# Patient Record
Sex: Male | Born: 1940 | Race: White | Hispanic: No | Smoking: Current every day smoker
Health system: Southern US, Community
[De-identification: ages and names within clinical notes are randomized; demographics above are authoritative.]

## PROBLEM LIST (undated history)

## (undated) DIAGNOSIS — E119 Type 2 diabetes mellitus without complications: Secondary | ICD-10-CM

## (undated) DIAGNOSIS — I10 Essential (primary) hypertension: Secondary | ICD-10-CM

## (undated) DIAGNOSIS — F0390 Unspecified dementia without behavioral disturbance: Secondary | ICD-10-CM

## (undated) HISTORY — PX: CHOLECYSTECTOMY: SHX55

---

## 2014-07-16 ENCOUNTER — Emergency Department (HOSPITAL_COMMUNITY): Payer: Medicare Other

## 2014-07-16 ENCOUNTER — Emergency Department (HOSPITAL_COMMUNITY)
Admission: EM | Admit: 2014-07-16 | Discharge: 2014-07-16 | Disposition: A | Discharge status: Home / Self Care | Payer: Medicare Other | Attending: Emergency Medicine | Admitting: Emergency Medicine

## 2014-07-16 ENCOUNTER — Encounter (HOSPITAL_COMMUNITY): Payer: Self-pay | Admitting: Emergency Medicine

## 2014-07-16 DIAGNOSIS — Y998 Other external cause status: Secondary | ICD-10-CM | POA: Insufficient documentation

## 2014-07-16 DIAGNOSIS — F0390 Unspecified dementia without behavioral disturbance: Secondary | ICD-10-CM | POA: Diagnosis not present

## 2014-07-16 DIAGNOSIS — Z720 Tobacco use: Secondary | ICD-10-CM | POA: Diagnosis not present

## 2014-07-16 DIAGNOSIS — E119 Type 2 diabetes mellitus without complications: Secondary | ICD-10-CM | POA: Diagnosis not present

## 2014-07-16 DIAGNOSIS — W19XXXA Unspecified fall, initial encounter: Secondary | ICD-10-CM

## 2014-07-16 DIAGNOSIS — W1839XA Other fall on same level, initial encounter: Secondary | ICD-10-CM | POA: Insufficient documentation

## 2014-07-16 DIAGNOSIS — S3992XA Unspecified injury of lower back, initial encounter: Secondary | ICD-10-CM | POA: Insufficient documentation

## 2014-07-16 DIAGNOSIS — Z794 Long term (current) use of insulin: Secondary | ICD-10-CM | POA: Diagnosis not present

## 2014-07-16 DIAGNOSIS — Y9389 Activity, other specified: Secondary | ICD-10-CM | POA: Diagnosis not present

## 2014-07-16 DIAGNOSIS — Y9289 Other specified places as the place of occurrence of the external cause: Secondary | ICD-10-CM | POA: Insufficient documentation

## 2014-07-16 DIAGNOSIS — M546 Pain in thoracic spine: Secondary | ICD-10-CM

## 2014-07-16 DIAGNOSIS — Z79899 Other long term (current) drug therapy: Secondary | ICD-10-CM | POA: Insufficient documentation

## 2014-07-16 HISTORY — DX: Type 2 diabetes mellitus without complications: E11.9

## 2014-07-16 HISTORY — DX: Unspecified dementia, unspecified severity, without behavioral disturbance, psychotic disturbance, mood disturbance, and anxiety: F03.90

## 2014-07-16 NOTE — ED Notes (Signed)
[facility] c/o a fall today in which he reports he may have hit his head. No LOC. Pt also c/o back pain after the fall. Pt requesting a snack upon assessment.

## 2014-07-16 NOTE — ED Notes (Signed)
[facility] Expected date:  Expected time:  Means of arrival:  Comments: EMS- elderly, fall

## 2014-07-16 NOTE — Discharge Instructions (Signed)
Return to the ED with any concerns including weakness of legs, not able to urinate, loss of control of bowel or bladder, decreased level of alertness/lethargy, or any other alarming symptoms °

## 2014-07-16 NOTE — ED Provider Notes (Signed)
CSN: [id number]     Arrival date & time 07/16/14  1733 History   First MD Initiated Contact with Patient 07/16/14 1747     Chief Complaint  Patient presents with  . Fall  . Back Pain     (Consider location/radiation/quality/duration/timing/severity/associated sxs/prior Treatment) HPI  A LEVEL 5 CAVEAT PERTAINS DUE TO DEMENTIA Pt presenting with c/o fall.  He c/o mid upper back pain.  No LOC.  He states he hit his head on triage, to me he denies hitting his head. He has a scrape on his left hand- denies pain in left hand.   Past Medical History  Diagnosis Date  . Dementia   . Diabetes mellitus without complication    History reviewed. No pertinent past surgical history. No family history on file. History  Substance Use Topics  . Smoking status: Current Every Day Smoker -- 1.00 packs/day    Types: Cigarettes  . Smokeless tobacco: Not on file  . Alcohol Use: No    Review of Systems  UNABLE TO OBTAIN ROS DUE TO LEVEL 5 CAVEAT    Allergies  Review of patient's allergies indicates no known allergies.  Home Medications   Prior to Admission medications   Medication Sig Start Date End Date Taking? Authorizing Provider  acetaminophen (TYLENOL) 325 MG tablet Take 650 mg by mouth 4 (four) times daily.   Yes Historical Provider, MD  acetaminophen (TYLENOL) 500 MG tablet Take 500 mg by mouth every 6 (six) hours as needed for fever (for fever 99.5-101).   Yes Historical Provider, MD  alum & mag hydroxide-simeth (MAALOX/MYLANTA) 200-200-20 MG/5ML suspension Take 30 mLs by mouth every 6 (six) hours as needed for indigestion or heartburn.   Yes Historical Provider, MD  carvedilol (COREG) 12.5 MG tablet Take 12.5 mg by mouth 2 (two) times daily.   Yes Historical Provider, MD  divalproex (DEPAKOTE) 250 MG DR tablet Take 250 mg by mouth 2 (two) times daily.   Yes Historical Provider, MD  donepezil (ARICEPT) 10 MG tablet Take 10 mg by mouth daily.   Yes Historical Provider, MD  DULoxetine  (CYMBALTA) 30 MG capsule Take 30 mg by mouth daily.   Yes Historical Provider, MD  ferrous sulfate 325 (65 FE) MG tablet Take 325 mg by mouth 2 (two) times daily.   Yes Historical Provider, MD  guaifenesin (ROBITUSSIN) 100 MG/5ML syrup Take 200 mg by mouth every 6 (six) hours as needed for cough.   Yes Historical Provider, MD  insulin glargine (LANTUS) 100 UNIT/ML injection Inject 16 Units into the skin at bedtime.   Yes Historical Provider, MD  latanoprost (XALATAN) 0.005 % ophthalmic solution Place 1 drop into both eyes at bedtime.   Yes Historical Provider, MD  loperamide (IMODIUM) 2 MG capsule Take 2 mg by mouth as needed for diarrhea or loose stools.   Yes Historical Provider, MD  LORazepam (ATIVAN) 0.5 MG tablet Take 0.5 mg by mouth 3 (three) times daily.   Yes Historical Provider, MD  losartan-hydrochlorothiazide (HYZAAR) 100-12.5 MG per tablet Take 1 tablet by mouth daily.   Yes Historical Provider, MD  magnesium hydroxide (MILK OF MAGNESIA) 400 MG/5ML suspension Take 30 mLs by mouth at bedtime as needed for mild constipation.   Yes Historical Provider, MD  metFORMIN (GLUCOPHAGE) 1000 MG tablet Take 1,000 mg by mouth 2 (two) times daily.   Yes Historical Provider, MD  neomycin-bacitracin-polymyxin (NEOSPORIN) [phone] ointment Apply 1 application topically as needed (for skin tears or abrasions).   Yes Historical Provider,  MD  pantoprazole (PROTONIX) 40 MG tablet Take 40 mg by mouth 2 (two) times daily.   Yes Historical Provider, MD  sertraline (ZOLOFT) 25 MG tablet Take 75 mg by mouth daily.   Yes Historical Provider, MD  sitaGLIPtin (JANUVIA) 100 MG tablet Take 100 mg by mouth daily.   Yes Historical Provider, MD  thiamine (VITAMIN B-1) 100 MG tablet Take 100 mg by mouth daily.   Yes Historical Provider, MD  traZODone (DESYREL) 50 MG tablet Take 50 mg by mouth at bedtime.   Yes Historical Provider, MD  Vitamin D, Ergocalciferol, (DRISDOL) 50000 UNITS CAPS capsule Take 50,000 Units by  mouth every 7 (seven) days. On saturday   Yes Historical Provider, MD   BP 159/70 mmHg  Pulse 72  Temp(Src) 98.5 F (36.9 C) (Oral)  Resp 18  SpO2 96%  Vitals reviewed Physical Exam  Physical Examination: General appearance - alert, well appearing, and in no distress Mental status - alert, oriented to person, place, and time Eyes - no conjunctival injection, no scleral icterus Neck - no midline tenderness to palpation Chest - clear to auscultation, no wheezes, rales or rhonchi, symmetric air entry Heart - normal rate, regular rhythm, normal S1, S2, no murmurs, rubs, clicks or gallops Abdomen - soft, nontender, nondistended, no masses or organomegaly Back exam - ttp in thoracic midline, no lumbar midline tenderness to palpation, no CVA tenderness Extremities - peripheral pulses normal, no pedal edema, no clubbing or cyanosis, no ttp over left hand or wrist ,no snuffbox tenderness Skin - normal coloration and turgor, no rashes, abrasion over thenar eminence of left hand  ED Course  Procedures (including critical care time) Labs Review Labs Reviewed - No data to display  Imaging Review Dg Thoracic Spine 2 View  07/16/2014   CLINICAL DATA:  Fall, thoracic spine pain.  EXAM: THORACIC SPINE - 2 VIEW  COMPARISON:  None.  FINDINGS: Degenerative changes throughout the thoracic spine with disc space narrowing and spurring. Normal alignment. No fracture.  IMPRESSION: Degenerative changes.  No acute bony abnormality.   Electronically Signed   By: Charlett Nose M.D.   On: 07/16/2014 18:53   Ct Head Wo Contrast  07/16/2014   CLINICAL DATA:  Initial encounter for fall today with trauma to back of head. Dementia.  EXAM: CT HEAD WITHOUT CONTRAST  TECHNIQUE: Contiguous axial images were obtained from the base of the skull through the vertex without intravenous contrast.  COMPARISON:  02/26/2014  FINDINGS: Sinuses/Soft tissues: Equivocal right posterior scalp soft tissue swelling on image 25 series 3. No  skull fracture. Clear paranasal sinuses and mastoid air cells.  Intracranial: Moderate low density in the periventricular white matter likely related to small vessel disease. Advanced cerebral atrophy. Vertebral and carotid atherosclerosis bilaterally. Remote lacunar infarct in the left basal ganglia. No mass lesion, hemorrhage, hydrocephalus, acute infarct, intra-axial, or extra-axial fluid collection.  IMPRESSION: 1.  No acute intracranial abnormality. 2.  Cerebral atrophy and small vessel ischemic change.   Electronically Signed   By: Jeronimo Greaves M.D.   On: 07/16/2014 19:29     EKG Interpretation None      MDM   Final diagnoses:  Fall  Midline thoracic back pain    Pt presenting with c/o mid upper back pain after fall today.  Unclear whether he hit his head.  No bony tenderness of hand, abrasion of thenar eminence.  Discharged with strict return precautions.  Pt agreeable with plan.    Ethelda Chick, MD 07/16/14  2314 

## 2014-07-16 NOTE — Progress Notes (Signed)
CSW met with patient at bedside. There was no family present. Patient confirms that he lives at [facility]. Patient states that he has been living there for 1 month. Patient states that he fell on his left side and that his back is in pain.  According to patient, he is usually able to bathe and put on his own clothes independently but needs assistance sometimes. Patient states he is always able to feed himself independently.   Per nurse, patient was transported to x-ray prior to the [place] interview.  Willette Brace [phone] ED CSW 07/16/2014 7:41 PM

## 2014-07-16 NOTE — ED Notes (Signed)
Skin tear to left hand  Just below thumb. Wound cleaned with Normal Saline and guaze. Wound dressed with non-adherent dressing and tegaderm.

## 2014-07-16 NOTE — ED Notes (Signed)
MD Linker at bedside.  

## 2014-07-16 NOTE — ED Notes (Signed)
Pt. Ready for discharge , [facility] called for transport. 

## 2014-07-19 ENCOUNTER — Emergency Department (HOSPITAL_COMMUNITY): Payer: Medicare Other

## 2014-07-19 ENCOUNTER — Encounter (HOSPITAL_COMMUNITY): Payer: Self-pay | Admitting: Emergency Medicine

## 2014-07-19 ENCOUNTER — Emergency Department (HOSPITAL_COMMUNITY)
Admission: EM | Admit: 2014-07-19 | Discharge: 2014-07-19 | Disposition: A | Discharge status: Home / Self Care | Payer: Medicare Other | Source: Home / Self Care | Attending: Emergency Medicine | Admitting: Emergency Medicine

## 2014-07-19 DIAGNOSIS — Y9389 Activity, other specified: Secondary | ICD-10-CM

## 2014-07-19 DIAGNOSIS — S2241XA Multiple fractures of ribs, right side, initial encounter for closed fracture: Secondary | ICD-10-CM | POA: Insufficient documentation

## 2014-07-19 DIAGNOSIS — Y998 Other external cause status: Secondary | ICD-10-CM

## 2014-07-19 DIAGNOSIS — F0390 Unspecified dementia without behavioral disturbance: Secondary | ICD-10-CM

## 2014-07-19 DIAGNOSIS — Y9289 Other specified places as the place of occurrence of the external cause: Secondary | ICD-10-CM

## 2014-07-19 DIAGNOSIS — E119 Type 2 diabetes mellitus without complications: Secondary | ICD-10-CM

## 2014-07-19 DIAGNOSIS — W19XXXA Unspecified fall, initial encounter: Secondary | ICD-10-CM | POA: Insufficient documentation

## 2014-07-19 DIAGNOSIS — Z79899 Other long term (current) drug therapy: Secondary | ICD-10-CM | POA: Insufficient documentation

## 2014-07-19 DIAGNOSIS — Z720 Tobacco use: Secondary | ICD-10-CM

## 2014-07-19 DIAGNOSIS — R531 Weakness: Secondary | ICD-10-CM | POA: Diagnosis not present

## 2014-07-19 DIAGNOSIS — Z794 Long term (current) use of insulin: Secondary | ICD-10-CM

## 2014-07-19 DIAGNOSIS — R0789 Other chest pain: Secondary | ICD-10-CM

## 2014-07-19 DIAGNOSIS — S2231XA Fracture of one rib, right side, initial encounter for closed fracture: Secondary | ICD-10-CM

## 2014-07-19 DIAGNOSIS — I6329 Cerebral infarction due to unspecified occlusion or stenosis of other precerebral arteries: Secondary | ICD-10-CM | POA: Diagnosis not present

## 2014-07-19 LAB — URINALYSIS, ROUTINE W REFLEX MICROSCOPIC
BILIRUBIN URINE: NEGATIVE
GLUCOSE, UA: NEGATIVE mg/dL
Ketones, ur: NEGATIVE mg/dL
Nitrite: NEGATIVE
Protein, ur: >300 mg/dL — AB
Specific Gravity, Urine: 1.021 (ref 1.005–1.030)
Urobilinogen, UA: 0.2 mg/dL (ref 0.0–1.0)
pH: 5 (ref 5.0–8.0)

## 2014-07-19 LAB — URINE MICROSCOPIC-ADD ON

## 2014-07-19 MED ORDER — TRAMADOL-ACETAMINOPHEN 37.5-325 MG PO TABS: 1.0000 | ORAL_TABLET | Freq: Four times a day (QID) | ORAL | Status: DC | PRN

## 2014-07-19 NOTE — Progress Notes (Signed)
CSW met with patient at bedside. There was no family present at bedside. Patient presented to [facility] last Friday from [place] due to fall. Per note, Staff report that patient has been complaining of R flank pain since he returned to the facility.   Per note, [facility] wants urinalysis. Patient stated to CSW that he feels a lot better. CSW confirmed from chart that patient is from [facility]. Patient has a history of dementia and was not able to answer some questions.  Rennis Petty was listed in patients chart as a primary contact - [phone]  Willette Brace [phone] ED CSW 07/19/2014 8:46 PM

## 2014-07-19 NOTE — ED Provider Notes (Signed)
CSN: [id number]     Arrival date & time 07/19/14  1458 History   First MD Initiated Contact with Patient 07/19/14 1529     Chief Complaint  Patient presents with  . Generalized Body Aches     (Consider location/radiation/quality/duration/timing/severity/associated sxs/prior Treatment) The history is provided by the patient and the nursing home.   Barry Buckley is a 74 y.o. male who has been sent here, by his nursing care facility for reported problem of right chest wall pain after a fall 3 days ago.  They also are requesting a urinalysis be performed, for an unclear reason.  Level V Caveat- Dementia    Past Medical History  Diagnosis Date  . Dementia   . Diabetes mellitus without complication    History reviewed. No pertinent past surgical history. History reviewed. No pertinent family history. History  Substance Use Topics  . Smoking status: Current Every Day Smoker -- 1.00 packs/day    Types: Cigarettes  . Smokeless tobacco: Not on file  . Alcohol Use: No    Review of Systems  Unable to perform ROS     Allergies  Review of patient's allergies indicates no known allergies.  Home Medications   Prior to Admission medications   Medication Sig Start Date End Date Taking? Authorizing Provider  acetaminophen (TYLENOL) 325 MG tablet Take 650 mg by mouth 4 (four) times daily.   Yes Historical Provider, MD  acetaminophen (TYLENOL) 500 MG tablet Take 500 mg by mouth every 6 (six) hours as needed for fever (for fever 99.5-101).   Yes Historical Provider, MD  alum & mag hydroxide-simeth (MAALOX/MYLANTA) 200-200-20 MG/5ML suspension Take 30 mLs by mouth every 6 (six) hours as needed for indigestion or heartburn.   Yes Historical Provider, MD  carvedilol (COREG) 12.5 MG tablet Take 12.5 mg by mouth 2 (two) times daily.   Yes Historical Provider, MD  divalproex (DEPAKOTE) 250 MG DR tablet Take 250 mg by mouth 2 (two) times daily.   Yes Historical Provider, MD  donepezil (ARICEPT) 10  MG tablet Take 10 mg by mouth daily.   Yes Historical Provider, MD  DULoxetine (CYMBALTA) 30 MG capsule Take 30 mg by mouth daily.   Yes Historical Provider, MD  ferrous sulfate 325 (65 FE) MG tablet Take 325 mg by mouth 2 (two) times daily.   Yes Historical Provider, MD  guaifenesin (ROBITUSSIN) 100 MG/5ML syrup Take 200 mg by mouth every 6 (six) hours as needed for cough.   Yes Historical Provider, MD  insulin glargine (LANTUS) 100 UNIT/ML injection Inject 16 Units into the skin at bedtime.   Yes Historical Provider, MD  latanoprost (XALATAN) 0.005 % ophthalmic solution Place 1 drop into both eyes at bedtime.   Yes Historical Provider, MD  loperamide (IMODIUM) 2 MG capsule Take 2 mg by mouth as needed for diarrhea or loose stools.   Yes Historical Provider, MD  LORazepam (ATIVAN) 0.5 MG tablet Take 0.5 mg by mouth 3 (three) times daily.   Yes Historical Provider, MD  losartan-hydrochlorothiazide (HYZAAR) 100-12.5 MG per tablet Take 1 tablet by mouth daily.   Yes Historical Provider, MD  magnesium hydroxide (MILK OF MAGNESIA) 400 MG/5ML suspension Take 30 mLs by mouth at bedtime as needed for mild constipation.   Yes Historical Provider, MD  metFORMIN (GLUCOPHAGE) 1000 MG tablet Take 1,000 mg by mouth 2 (two) times daily.   Yes Historical Provider, MD  neomycin-bacitracin-polymyxin (NEOSPORIN) [phone] ointment Apply 1 application topically as needed (for skin tears or abrasions).  Yes Historical Provider, MD  pantoprazole (PROTONIX) 40 MG tablet Take 40 mg by mouth 2 (two) times daily.   Yes Historical Provider, MD  sertraline (ZOLOFT) 25 MG tablet Take 75 mg by mouth daily.   Yes Historical Provider, MD  sitaGLIPtin (JANUVIA) 100 MG tablet Take 100 mg by mouth daily.   Yes Historical Provider, MD  thiamine (VITAMIN B-1) 100 MG tablet Take 100 mg by mouth daily.   Yes Historical Provider, MD  traZODone (DESYREL) 50 MG tablet Take 50 mg by mouth at bedtime.   Yes Historical Provider, MD  Vitamin  D, Ergocalciferol, (DRISDOL) 50000 UNITS CAPS capsule Take 50,000 Units by mouth every Saturday.    Yes Historical Provider, MD  traMADol-acetaminophen (ULTRACET) 37.5-325 MG per tablet Take 1 tablet by mouth every 6 (six) hours as needed. 07/19/14   Flint Melter, MD   BP 195/94 mmHg  Pulse 65  Temp(Src) 98.1 F (36.7 C) (Oral)  Resp 16  SpO2 96% Physical Exam  Constitutional: He appears well-developed and well-nourished.  HENT:  Head: Normocephalic and atraumatic.  Right Ear: External ear normal.  Left Ear: External ear normal.  Eyes: Conjunctivae and EOM are normal. Pupils are equal, round, and reactive to light.  Neck: Normal range of motion and phonation normal. Neck supple.  Cardiovascular: Normal rate, regular rhythm and normal heart sounds.   Pulmonary/Chest: Effort normal and breath sounds normal. He exhibits tenderness (Right chest wall, diffuse, without crepitation.). He exhibits no bony tenderness.  Abdominal: Soft. There is no tenderness.  Musculoskeletal: Normal range of motion.  Tenderness of lower thoracic spine without palpable step-off.  Neurological: He is alert. No cranial nerve deficit or sensory deficit. He exhibits normal muscle tone. Coordination normal.  Normal strength in arms and legs bilaterally  Skin: Skin is warm, dry and intact.  Scattered bruising of the forearms bilaterally.  Psychiatric: He has a normal mood and affect. His behavior is normal.  Nursing note and vitals reviewed.   ED Course  Procedures (including critical care time)  Medications - No data to display  Patient Vitals for the past 24 hrs:  BP Temp Temp src Pulse Resp SpO2  07/19/14 2040 195/94 mmHg 98.1 F (36.7 C) Oral 65 16 96 %  07/19/14 1931 152/78 mmHg 97.8 F (36.6 C) Oral 64 18 95 %  07/19/14 1508 158/77 mmHg 97.8 F (36.6 C) Oral 67 20 95 %  07/19/14 1500 - - - - - 95 %    At D/C Reevaluation with update and discussion. After initial assessment and treatment, an  updated evaluation reveals he is able to ambulate, no additional c/o.Marland Kitchen [name]    Labs Review Labs Reviewed  URINALYSIS, ROUTINE W REFLEX MICROSCOPIC - Abnormal; Notable for the following:    Hgb urine dipstick TRACE (*)    Protein, ur >300 (*)    Leukocytes, UA TRACE (*)    All other components within normal limits  URINE MICROSCOPIC-ADD ON    Imaging Review Dg Chest 2 View (if Patient Has Fever And/or Copd)  07/19/2014   CLINICAL DATA:  [place] Initial encounter for fall 3 days ago 0 and now complaining of right flank pain.  EXAM: CHEST  2 VIEW  COMPARISON:  None.  FINDINGS: Lung volumes are low without focal airspace consolidation or pulmonary edema. No pleural effusion. No evidence for pneumothorax. Interstitial markings are diffusely coarsened with chronic features. Cardiopericardial silhouette is at upper limits of normal for size. Acute fractures of the right fifth  and sixth ribs are evident.  IMPRESSION: Mild vascular congestion with chronic underlying interstitial coarsening.  Fractures of the posterior right fifth and sixth ribs.   Electronically Signed   By: Kennith CenterEric  Mansell M.D.   On: 07/19/2014 15:43   Dg Ribs Unilateral Right  07/19/2014   CLINICAL DATA:  74 year old male all fall 3 days ago. Right flank pain.  EXAM: RIGHT RIBS - 2 VIEW  COMPARISON:  07/19/2014 chest x-ray.  FINDINGS: Acute right fourth through ninth rib fracture. Right tenth rib may also be fractured.  Remote right third and tenth rib fracture.  No obvious pneumothorax.  IMPRESSION: Acute right fourth through ninth rib fracture. Right tenth rib may also be fractured.   Electronically Signed   By: Bridgett LarssonSteve  Olson M.D.   On: 07/19/2014 16:45   Dg Thoracic Spine 2 View  07/19/2014   CLINICAL DATA:  74 year old male with the midline lower back pain. Recent fall on Friday.  EXAM: THORACIC SPINE - 2 VIEW  COMPARISON:  Concurrently obtained radiographs of the lumbar spine, chest and ribs  FINDINGS: No evidence of acute  fracture or malalignment. Multilevel flowing anterior osteophytes consistent with dystrophic idiopathic skeletal hyperostosis. Overall, the bones are slightly osteopenic. Atherosclerotic calcifications are noted in the thoracic aorta. A cardiac stent is visible.  IMPRESSION: 1. No acute fracture or malalignment. 2. Dystrophic idiopathic skeletal hyperostosis (DISH). 3. Aortic atherosclerosis.   Electronically Signed   By: Malachy MoanHeath  McCullough M.D.   On: 07/19/2014 16:42   Dg Lumbar Spine Complete  07/19/2014   CLINICAL DATA:  Fall 3 days ago with persistent right flank pain. Midline lower back pain and pain with coughing.  EXAM: LUMBAR SPINE - COMPLETE 4+ VIEW  COMPARISON:  CT 02/27/2014  FINDINGS: Vertebral body alignment and heights are within normal. There is moderate spondylosis throughout the lumbar spine and visualized thoracic spine. There is disc space narrowing at the L3-4 and L5-S1 levels. These findings are unchanged. There is no compression fracture or subluxation. There is moderate facet arthropathy. There is moderate calcified plaque over the abdominal aorta and iliac arteries.  IMPRESSION: No acute findings.  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   Electronically Signed   By: Elberta Fortisaniel  Boyle M.D.   On: 07/19/2014 16:44     EKG Interpretation None      MDM   Final diagnoses:  Pain, chest wall  Fracture of rib of right side, closed, initial encounter    Contusion chest wall, with rib fractures.  These are subacute.  No evidence for urinary tract infection.  Nursing Notes Reviewed/ Care Coordinated Applicable Imaging Reviewed Interpretation of Laboratory Data incorporated into ED treatment  The patient appears reasonably screened and/or stabilized for discharge and I doubt any other medical condition or other [facility] requiring further screening, evaluation, or treatment in the ED at this time prior to discharge.  Plan: Home Medications-  Ultracet; Home Treatments-Use walker if needed for assistance with walking,  rest; return here if the recommended treatment, does not improve the symptoms; Recommended follow up- PCP check up 1 week   Flint MelterElliott L Kohana Amble, MD 07/20/14 0002

## 2014-07-19 NOTE — Discharge Instructions (Signed)
You'll need to use a walker, or get assistance, whenever you get up, to prevent you from falling. See your doctor within the next week to be evaluated for the problems walking, and get a recheck on the rib fractures. We checked your urine today for signs of injury or infection, and there were no problems found.   Rib Fracture A rib fracture is a break or crack in one of the bones of the ribs. The ribs are a group of long, curved bones that wrap around your chest and attach to your spine. They protect your lungs and other organs in the chest cavity. A broken or cracked rib is often painful, but most do not cause other problems. Most rib fractures heal on their own over time. However, rib fractures can be more serious if multiple ribs are broken or if broken ribs move out of place and push against other structures. CAUSES   A direct blow to the chest. For example, this could happen during contact sports, a car accident, or a fall against a hard object.  Repetitive movements with high force, such as pitching a baseball or having severe coughing spells. SYMPTOMS   Pain when you breathe in or cough.  Pain when someone presses on the injured area. DIAGNOSIS  Your caregiver will perform a physical exam. Various imaging tests may be ordered to confirm the diagnosis and to look for related injuries. These tests may include a chest X-ray, computed tomography (CT), magnetic resonance imaging (MRI), or a bone scan. TREATMENT  Rib fractures usually heal on their own in 1-3 months. The longer healing period is often associated with a continued cough or other aggravating activities. During the healing period, pain control is very important. Medication is usually given to control pain. Hospitalization or surgery may be needed for more severe injuries, such as those in which multiple ribs are broken or the ribs have moved out of place.  HOME CARE INSTRUCTIONS   Avoid strenuous activity and any activities or  movements that cause pain. Be careful during activities and avoid bumping the injured rib.  Gradually increase activity as directed by your caregiver.  Only take over-the-counter or prescription medications as directed by your caregiver. Do not take other medications without asking your caregiver first.  Apply ice to the injured area for the first 1-2 days after you have been treated or as directed by your caregiver. Applying ice helps to reduce inflammation and pain.  Put ice in a plastic bag.  Place a towel between your skin and the bag.   Leave the ice on for 15-20 minutes at a time, every 2 hours while you are awake.  Perform deep breathing as directed by your caregiver. This will help prevent pneumonia, which is a common complication of a broken rib. Your caregiver may instruct you to:  Take deep breaths several times a day.  Try to cough several times a day, holding a pillow against the injured area.  Use a device called an incentive spirometer to practice deep breathing several times a day.  Drink enough fluids to keep your urine clear or pale yellow. This will help you avoid constipation.   Do not wear a rib belt or binder. These restrict breathing, which can lead to pneumonia.  SEEK IMMEDIATE MEDICAL CARE IF:   You have a fever.   You have difficulty breathing or shortness of breath.   You develop a continual cough, or you cough up thick or bloody sputum.  You  feel sick to your stomach (nausea), throw up (vomit), or have abdominal pain.   You have worsening pain not controlled with medications.  MAKE SURE YOU:  Understand these instructions.  Will watch your condition.  Will get help right away if you are not doing well or get worse. Document Released: 06/18/2005 Document Revised: 02/18/2013 Document Reviewed: 08/20/2012 [facility] Patient Information 2015 [employer] [place]. This information is not intended to replace advice given to you by your health care  provider. Make sure you discuss any questions you have with your health care provider.

## 2014-07-19 NOTE — ED Notes (Signed)
[facility] Expected date:  Expected time:  Means of arrival:  Comments: Ems- back pain

## 2014-07-19 NOTE — ED Notes (Signed)
[facility] was called for pt's transportation back to [facility]. 

## 2014-07-19 NOTE — ED Notes (Signed)
Per ems. Pt had fall on Friday, was sent here for eval and then [facility]. Staff report pt has been complaining of R flank pain since he returned and nursing home wants urinalysis. Pt has bilateral rhonchi and nursing home also wants chest x ray. Pt has hx of dementia, but is able to answer questions, alert per norm. Pt reports pain is mostly in his midline lower back and has pain with coughing. Has had cough, no sputum. Denies dysuria.

## 2014-07-19 NOTE — ED Notes (Signed)
[facility] here to transport pt back to [facility]. 

## 2014-07-21 ENCOUNTER — Emergency Department (HOSPITAL_COMMUNITY): Payer: Medicare Other

## 2014-07-21 ENCOUNTER — Inpatient Hospital Stay (HOSPITAL_COMMUNITY)
Admission: EM | Admit: 2014-07-21 | Discharge: 2014-07-26 | DRG: 065 | Disposition: A | Discharge status: Skilled Nursing Facility | Payer: Medicare Other | Attending: Internal Medicine | Admitting: Internal Medicine

## 2014-07-21 ENCOUNTER — Encounter (HOSPITAL_COMMUNITY): Payer: Self-pay | Admitting: *Deleted

## 2014-07-21 DIAGNOSIS — E860 Dehydration: Secondary | ICD-10-CM | POA: Diagnosis present

## 2014-07-21 DIAGNOSIS — F17210 Nicotine dependence, cigarettes, uncomplicated: Secondary | ICD-10-CM | POA: Diagnosis present

## 2014-07-21 DIAGNOSIS — I69354 Hemiplegia and hemiparesis following cerebral infarction affecting left non-dominant side: Secondary | ICD-10-CM | POA: Diagnosis not present

## 2014-07-21 DIAGNOSIS — D509 Iron deficiency anemia, unspecified: Secondary | ICD-10-CM | POA: Diagnosis present

## 2014-07-21 DIAGNOSIS — D72829 Elevated white blood cell count, unspecified: Secondary | ICD-10-CM | POA: Diagnosis present

## 2014-07-21 DIAGNOSIS — N189 Chronic kidney disease, unspecified: Secondary | ICD-10-CM | POA: Diagnosis present

## 2014-07-21 DIAGNOSIS — I129 Hypertensive chronic kidney disease with stage 1 through stage 4 chronic kidney disease, or unspecified chronic kidney disease: Secondary | ICD-10-CM | POA: Diagnosis present

## 2014-07-21 DIAGNOSIS — E46 Unspecified protein-calorie malnutrition: Secondary | ICD-10-CM | POA: Diagnosis present

## 2014-07-21 DIAGNOSIS — I6329 Cerebral infarction due to unspecified occlusion or stenosis of other precerebral arteries: Secondary | ICD-10-CM | POA: Diagnosis present

## 2014-07-21 DIAGNOSIS — I639 Cerebral infarction, unspecified: Secondary | ICD-10-CM

## 2014-07-21 DIAGNOSIS — Z6827 Body mass index (BMI) 27.0-27.9, adult: Secondary | ICD-10-CM | POA: Diagnosis not present

## 2014-07-21 DIAGNOSIS — Z794 Long term (current) use of insulin: Secondary | ICD-10-CM | POA: Diagnosis not present

## 2014-07-21 DIAGNOSIS — R531 Weakness: Secondary | ICD-10-CM | POA: Diagnosis present

## 2014-07-21 DIAGNOSIS — E785 Hyperlipidemia, unspecified: Secondary | ICD-10-CM | POA: Diagnosis present

## 2014-07-21 DIAGNOSIS — Z9181 History of falling: Secondary | ICD-10-CM | POA: Diagnosis not present

## 2014-07-21 DIAGNOSIS — E1159 Type 2 diabetes mellitus with other circulatory complications: Secondary | ICD-10-CM | POA: Insufficient documentation

## 2014-07-21 DIAGNOSIS — F0390 Unspecified dementia without behavioral disturbance: Secondary | ICD-10-CM | POA: Insufficient documentation

## 2014-07-21 DIAGNOSIS — N289 Disorder of kidney and ureter, unspecified: Secondary | ICD-10-CM

## 2014-07-21 DIAGNOSIS — E119 Type 2 diabetes mellitus without complications: Secondary | ICD-10-CM | POA: Diagnosis present

## 2014-07-21 DIAGNOSIS — R2990 Unspecified symptoms and signs involving the nervous system: Secondary | ICD-10-CM

## 2014-07-21 DIAGNOSIS — N179 Acute kidney failure, unspecified: Secondary | ICD-10-CM | POA: Diagnosis present

## 2014-07-21 DIAGNOSIS — I10 Essential (primary) hypertension: Secondary | ICD-10-CM | POA: Insufficient documentation

## 2014-07-21 HISTORY — DX: Essential (primary) hypertension: I10

## 2014-07-21 LAB — CBC WITH DIFFERENTIAL/PLATELET
Basophils Absolute: 0 10*3/uL (ref 0.0–0.1)
Basophils Relative: 0 % (ref 0–1)
Eosinophils Absolute: 0 10*3/uL (ref 0.0–0.7)
Eosinophils Relative: 0 % (ref 0–5)
HCT: 30.5 % — ABNORMAL LOW (ref 39.0–52.0)
Hemoglobin: 10.1 g/dL — ABNORMAL LOW (ref 13.0–17.0)
Lymphocytes Relative: 12 % (ref 12–46)
Lymphs Abs: 1.6 10*3/uL (ref 0.7–4.0)
MCH: 29.1 pg (ref 26.0–34.0)
MCHC: 33.1 g/dL (ref 30.0–36.0)
MCV: 87.9 fL (ref 78.0–100.0)
Monocytes Absolute: 0.8 10*3/uL (ref 0.1–1.0)
Monocytes Relative: 6 % (ref 3–12)
Neutro Abs: 10.5 10*3/uL — ABNORMAL HIGH (ref 1.7–7.7)
Neutrophils Relative %: 82 % — ABNORMAL HIGH (ref 43–77)
Platelets: 256 10*3/uL (ref 150–400)
RBC: 3.47 MIL/uL — ABNORMAL LOW (ref 4.22–5.81)
RDW: 14.7 % (ref 11.5–15.5)
WBC: 12.9 10*3/uL — ABNORMAL HIGH (ref 4.0–10.5)

## 2014-07-21 LAB — BASIC METABOLIC PANEL
Anion gap: 13 (ref 5–15)
BUN: 57 mg/dL — ABNORMAL HIGH (ref 6–23)
CO2: 21 mmol/L (ref 19–32)
Calcium: 9.5 mg/dL (ref 8.4–10.5)
Chloride: 105 mEq/L (ref 96–112)
Creatinine, Ser: 2.9 mg/dL — ABNORMAL HIGH (ref 0.50–1.35)
GFR calc Af Amer: 23 mL/min — ABNORMAL LOW (ref >90)
GFR calc non Af Amer: 20 mL/min — ABNORMAL LOW (ref >90)
Glucose, Bld: 173 mg/dL — ABNORMAL HIGH (ref 70–99)
Potassium: 4 mmol/L (ref 3.5–5.1)
Sodium: 139 mmol/L (ref 135–145)

## 2014-07-21 LAB — URINALYSIS, ROUTINE W REFLEX MICROSCOPIC
Glucose, UA: NEGATIVE mg/dL
Hgb urine dipstick: NEGATIVE
Ketones, ur: 15 mg/dL — AB
Leukocytes, UA: NEGATIVE
Nitrite: NEGATIVE
Protein, ur: >300 mg/dL — AB
Specific Gravity, Urine: 1.027 (ref 1.005–1.030)
Urobilinogen, UA: 0.2 mg/dL (ref 0.0–1.0)
pH: 5 (ref 5.0–8.0)

## 2014-07-21 LAB — I-STAT CHEM 8, ED
BUN: 54 mg/dL — ABNORMAL HIGH (ref 6–23)
Calcium, Ion: 1.27 mmol/L (ref 1.13–1.30)
Chloride: 108 mEq/L (ref 96–112)
Creatinine, Ser: 2.7 mg/dL — ABNORMAL HIGH (ref 0.50–1.35)
Glucose, Bld: 177 mg/dL — ABNORMAL HIGH (ref 70–99)
HCT: 31 % — ABNORMAL LOW (ref 39.0–52.0)
Hemoglobin: 10.5 g/dL — ABNORMAL LOW (ref 13.0–17.0)
Potassium: 4.1 mmol/L (ref 3.5–5.1)
Sodium: 141 mmol/L (ref 135–145)
TCO2: 17 mmol/L (ref 0–100)

## 2014-07-21 LAB — URINE MICROSCOPIC-ADD ON

## 2014-07-21 MED ORDER — SODIUM CHLORIDE 0.9 % IV BOLUS (SEPSIS)
1000.0000 mL | Freq: Once | INTRAVENOUS | Status: AC
  Administered 2014-07-21: 1000 mL via INTRAVENOUS

## 2014-07-21 MED ORDER — MORPHINE SULFATE 4 MG/ML IJ SOLN
4.0000 mg | Freq: Once | INTRAMUSCULAR | Status: AC
  Administered 2014-07-21: 4 mg via INTRAVENOUS
  Filled 2014-07-21: qty 1

## 2014-07-21 MED ORDER — ASPIRIN 81 MG PO CHEW: 324.0000 mg | CHEWABLE_TABLET | Freq: Once | ORAL | Status: DC

## 2014-07-21 MED ORDER — ASPIRIN 300 MG RE SUPP
300.0000 mg | Freq: Once | RECTAL | Status: AC
  Administered 2014-07-21: 300 mg via RECTAL
  Filled 2014-07-21: qty 1

## 2014-07-21 NOTE — Consult Note (Signed)
Referring Physician: Dr. Wilson Singer    Chief Complaint: left hemiparesis  HPI:                                                                                                                                         Barry Buckley is an 74 y.o. male, right handed, with a past medical history significant for DM type II and dementia, brought in via EMS from [facility] for further evaluation of left sided weakness. Patient has dementia and can not reliably contribute to his clinical information, hence all history was obtained from the chart: " he fell Friday and was diagnosed with fractured ribs. Staff reports that around noon he was helped into a wheelchair and they noticed he was dragging his left leg. Staff noticed the same at 1500 when they helped pt to the bathroom. Staff is unsure of last known well".  " L sided weakness. First noticed by staff around 1200 today. Unsure when last normal. Only offered complaint is of chest pain which he has had for several days. Seen in ED a two days ago after fall and having CP." Patient denies having left sided weakness, HA, vertigo, double vision, slurred speech, language or vision impairment. CT brain tonight reviewed by myself showed no acute abnormality. Slight leukocytosis, Cr. 2.90 Date last known well: uncertain  Time last known well: uncertain tPA Given: no, out of the window   Past Medical History  Diagnosis Date  . Dementia   . Diabetes mellitus without complication     History reviewed. No pertinent past surgical history. Family history: unable to obtain due to dementia and family being unavailable at this moment No family history on file. Social History:  reports that he has been smoking Cigarettes.  He has been smoking about 1.00 pack per day. He does not have any smokeless tobacco history on file. He reports that he does not drink alcohol. His drug history is not on file.  Allergies: No Known Allergies  Medications:                                                                                                                            I have reviewed the patient's current medications.  ROS: unable to obtain due to mental status  History obtained from chart review   Physical exam: pleasant male in no apparent distress. Blood pressure 142/65, pulse 74, temperature 97.6 F (36.4 C), temperature source Oral, resp. rate 17, height 5' 11" (1.803 m), weight 88.451 kg (195 lb), SpO2 93 %. Head: normocephalic. Neck: supple, no bruits, no JVD. Cardiac: no murmurs. Lungs: clear. Abdomen: soft, no tender, no mass. Extremities: no edema. Skin: no rash  Neurologic Examination:                                                                                                      General: Mental Status: Alert, awake, disoriented to year-month-place and situation.  Speech fluent without evidence of aphasia.  Able to follow 3 step commands without difficulty. Cranial Nerves: II: Discs flat bilaterally; Visual fields grossly normal, pupils equal, round, reactive to light and accommodation III,IV, VI: ptosis not present, extra-ocular motions intact bilaterally V,VII: smile symmetric, facial light touch sensation normal bilaterally VIII: hearing normal bilaterally IX,X: gag reflex present XI: bilateral shoulder shrug XII: midline tongue extension without atrophy or fasciculations Motor: Significant for left hemiparesis Tone and bulk:normal tone throughout; no atrophy noted Sensory: Pinprick and light touch intact throughout, bilaterally Deep Tendon Reflexes:  Right: Upper Extremity   Left: Upper extremity   biceps (C-5 to C-6) 2/4   biceps (C-5 to C-6) 2/4 tricep (C7) 2/4    triceps (C7) 2/4 Brachioradialis (C6) 2/4  Brachioradialis (C6) 2/4  Lower Extremity Lower Extremity  quadriceps (L-2 to L-4) 2/4   quadriceps (L-2 to L-4) 2/4 Achilles  (S1) 2/4   Achilles (S1) 2/4  Plantars: Right: downgoing   Left: upgoing Cerebellar: normal finger-to-nose in the right but impaired in the left due to weakness,  heel-to-shin no tested Gait:  Unable to test      Results for orders placed or performed during the hospital encounter of 07/21/14 (from the past 48 hour(s))  I-Stat Chem 8, ED     Status: Abnormal   Collection Time: 07/21/14  7:37 PM  Result Value Ref Range   Sodium 141 135 - 145 mmol/L   Potassium 4.1 3.5 - 5.1 mmol/L   Chloride 108 96 - 112 mEq/L   BUN 54 (H) 6 - 23 mg/dL   Creatinine, Ser 2.70 (H) 0.50 - 1.35 mg/dL   Glucose, Bld 177 (H) 70 - 99 mg/dL   Calcium, Ion 1.27 1.13 - 1.30 mmol/L   TCO2 17 0 - 100 mmol/L   Hemoglobin 10.5 (L) 13.0 - 17.0 g/dL   HCT 31.0 (L) 39.0 - 76.2 %  Basic metabolic panel     Status: Abnormal   Collection Time: 07/21/14  8:20 PM  Result Value Ref Range   Sodium 139 135 - 145 mmol/L    Comment: Please note change in reference range.   Potassium 4.0 3.5 - 5.1 mmol/L    Comment: Please note change in reference range.   Chloride 105 96 - 112 mEq/L   CO2 21 19 - 32 mmol/L   Glucose, Bld 173 (H) 70 - 99 mg/dL   BUN 57 (H)  6 - 23 mg/dL   Creatinine, Ser 2.90 (H) 0.50 - 1.35 mg/dL   Calcium 9.5 8.4 - 10.5 mg/dL   GFR calc non Af Amer 20 (L) >90 mL/min   GFR calc Af Amer 23 (L) >90 mL/min    Comment: (NOTE) The eGFR has been calculated using the CKD EPI equation. This calculation has not been validated in all clinical situations. eGFR's persistently <90 mL/min signify possible Chronic Kidney Disease.    Anion gap 13 5 - 15  CBC with Differential     Status: Abnormal   Collection Time: 07/21/14  8:20 PM  Result Value Ref Range   WBC 12.9 (H) 4.0 - 10.5 K/uL   RBC 3.47 (L) 4.22 - 5.81 MIL/uL   Hemoglobin 10.1 (L) 13.0 - 17.0 g/dL   HCT 30.5 (L) 39.0 - 52.0 %   MCV 87.9 78.0 - 100.0 fL   MCH 29.1 26.0 - 34.0 pg   MCHC 33.1 30.0 - 36.0 g/dL   RDW 14.7 11.5 - 15.5 %    Platelets 256 150 - 400 K/uL   Neutrophils Relative % 82 (H) 43 - 77 %   Neutro Abs 10.5 (H) 1.7 - 7.7 K/uL   Lymphocytes Relative 12 12 - 46 %   Lymphs Abs 1.6 0.7 - 4.0 K/uL   Monocytes Relative 6 3 - 12 %   Monocytes Absolute 0.8 0.1 - 1.0 K/uL   Eosinophils Relative 0 0 - 5 %   Eosinophils Absolute 0.0 0.0 - 0.7 K/uL   Basophils Relative 0 0 - 1 %   Basophils Absolute 0.0 0.0 - 0.1 K/uL  Urinalysis, Routine w reflex microscopic     Status: Abnormal   Collection Time: 07/21/14  9:30 PM  Result Value Ref Range   Color, Urine YELLOW YELLOW   APPearance CLOUDY (A) CLEAR   Specific Gravity, Urine 1.027 1.005 - 1.030   pH 5.0 5.0 - 8.0   Glucose, UA NEGATIVE NEGATIVE mg/dL   Hgb urine dipstick NEGATIVE NEGATIVE   Bilirubin Urine SMALL (A) NEGATIVE   Ketones, ur 15 (A) NEGATIVE mg/dL   Protein, ur >300 (A) NEGATIVE mg/dL   Urobilinogen, UA 0.2 0.0 - 1.0 mg/dL   Nitrite NEGATIVE NEGATIVE   Leukocytes, UA NEGATIVE NEGATIVE  Urine microscopic-add on     Status: Abnormal   Collection Time: 07/21/14  9:30 PM  Result Value Ref Range   Squamous Epithelial / LPF RARE RARE   Bacteria, UA FEW (A) RARE   Casts HYALINE CASTS (A) NEGATIVE   Ct Head Wo Contrast  07/21/2014   CLINICAL DATA:  Left sided weakness.  EXAM: CT HEAD WITHOUT CONTRAST  TECHNIQUE: Contiguous axial images were obtained from the base of the skull through the vertex without intravenous contrast.  COMPARISON:  07/16/2014  FINDINGS: Ventricles normal in overall configuration. There is ventricular and sulcal enlargement reflecting moderate atrophy. Several small lacune infarcts are noted in the basal ganglia and central white matter. Patchy white matter hypoattenuation is noted elsewhere consistent with chronic microvascular ischemic change.  No parenchymal masses or mass effect. There is no evidence of a recent cortical infarct.  There are no extra-axial masses or abnormal fluid collections.  There is no intracranial hemorrhage.   Visualized sinuses and mastoid air cells are clear. No skull lesion.  IMPRESSION: 1. No acute intracranial abnormalities. 2. No change from the recent prior CT scan the brain.   Electronically Signed   By: Lajean Manes M.D.   On: 07/21/2014  22:33    Assessment: 74 y.o. male with DM and dementia, brought in due to new onset left hemiparesis. CT brain without acute abnormality. No a candidate for thrombolysis due to unknown last seen normal. Suspect right brain infarct. Suggest completion of stroke work up and aspirin after passing swallowing evaluation. PT. Stroke team will follow up tomorrow.   Stroke Risk Factors - age,HTN  Plan: 1. HgbA1c, fasting lipid panel 2. MRI, MRA  of the brain without contrast 3. Echocardiogram 4. Carotid dopplers 5. Prophylactic therapy-aspirin after passing swallowing evaluation 6. Risk factor modification 7. Telemetry monitoring 8. Frequent neuro checks 9. PT/OT SLP  Dorian Pod ,MD [facility] [phone]  07/21/2014, 11:18 PM

## 2014-07-21 NOTE — H&P (Signed)
Date: 07/22/2014               Patient Name:  Barry Buckley MRN: [id number]  DOB: [birth year] Age / Sex: 74 y.o., male   PCP: Ron Parker, MD         Medical Service: Internal Medicine Teaching Service         Attending Physician: Dr. Earl Lagos, MD    First Contact: Dr. Eleonore Chiquito Pager: [phone]  Second Contact: Dr. Lauris Chroman Pager: [phone]       After Hours (After 5p/  First Contact Pager: [phone]  weekends / holidays): Second Contact Pager: [phone]   Chief Complaint: L sided weakness  History of Present Illness: Mr Joines is a 74 year old man with dementia, DM2, HTN who presented from [facility] nursing home with L sided weakness. History severely limited by Mr Bethel's dementia; his interview was primarily a review of systems. It does not appear he has any previous EPIC visits in Care Everywhere. Per EMR, the nursing home staff reported that around 1200 he was being helped into a wheelchair and they saw him dragging his left leg. They also saw him similarly dragging his leg at 1500 when helping him to the bathroom. The staff was unclear of the last time he was seen without this deficit. On interview, he was not clear if he felt weak anywhere. He says he thinks he may have had a stroke in the past? He denies any fevers, chest pain, SOB, headache, vision change, numbness, paresthesia, difficulty speaking or swallowing, abdominal pain, nausea, emesis, constipation, diarrhea, dysuria.  Of note, he was seen twice in the past week at the [facility] related to a fall on 07/16/14 where he told triage he hit his head but later denied hitting his head. Negative head Ct at that time w T spine film demonstrating degenerative changes and no acute bony abnormality. He then returned on 07/19/14 with R chest wall pain found to have fractures of the R 4th-9th ribs with the R 10th rib also possibly fractured. He was discharged from ED with tramadol-acetaminophen 37.5-325 mg 1 tab q6hprn x 30 tabs.  We  spoke to [facility] nursing staff tonight. They confirmed he is full code.They will fax over records as did not provide further PMH than "he has dementia, hypertension, and diabetes."  In the ED tonight, he received ASA 300 mg pr once, morphine 4 mg iv once and 1 L NS bolus. Neurology was called but had not yet seen the patient.  Meds: Current Facility-Administered Medications  Medication Dose Route Frequency Provider Last Rate Last Dose  .  stroke: mapping our early stages of recovery book   Does not apply Once Genelle Gather, MD      . 0.9 %  sodium chloride infusion   Intravenous Continuous Genelle Gather, MD      . aspirin suppository 300 mg  300 mg Rectal Daily Genelle Gather, MD       Or  . aspirin tablet 325 mg  325 mg Oral Daily Genelle Gather, MD      . heparin injection 5,000 Units  5,000 Units Subcutaneous 3 times per day Genelle Gather, MD      . insulin aspart (novoLOG) injection 0-15 Units  0-15 Units Subcutaneous 6 times per day Genelle Gather, MD      . senna-docusate (Senokot-S) tablet 1 tablet  1 tablet Oral QHS PRN Genelle Gather, MD  Allergies: Allergies as of 07/21/2014  . (No Known Allergies)   Past Medical History  Diagnosis Date  . Dementia   . Diabetes mellitus without complication    History reviewed. No pertinent past surgical history. No family history on file. History   Social History  . Marital Status: Unknown    Spouse Name: N/A    Number of Children: N/A  . Years of Education: N/A   Occupational History  . Not on file.   Social History Main Topics  . Smoking status: Current Every Day Smoker -- 1.00 packs/day    Types: Cigarettes  . Smokeless tobacco: Not on file  . Alcohol Use: No  . Drug Use: Not on file  . Sexual Activity: Not on file   Other Topics Concern  . Not on file   Social History Narrative    Review of Systems: A comprehensive review of systems was negative except for: as noted above per HPI  Physical  Exam: Blood pressure 156/71, pulse 65, temperature 98.1 F (36.7 C), temperature source Oral, resp. rate 17, height 5\' 11"  (1.803 m), weight 185 lb 3.2 oz (84.006 kg), SpO2 95 %.   Limited by patient cooperation  Gen: No acute distress, well developed, well nourished HEENT: Atraumatic, PERRL, EOMI, sclerae anicteric, dry mucous membranes Heart: Regular rate and rhythm, normal S1 S2, no murmurs, rubs, or gallops Lungs: Clear to auscultation bilaterally anteriorly, respirations unlabored Abd: Soft, non-tender, non-distended, + bowel sounds, no hepatosplenomegaly Ext: No edema or cyanosis Neuro: Alert and oriented to person (he knew his birthday), speech clear, CN II-XII intact, sensation grossly intact, 5/5 strength R upper and lower extremities, 5/5 L upper extremity but weaker than R, 4/5 lower extremity, no Babinski sign  Lab results: Basic Metabolic Panel:  Recent Labs  16/04/9600/20/16 1937 07/21/14 2020  NA 141 139  K 4.1 4.0  CL 108 105  CO2  --  21  GLUCOSE 177* 173*  BUN 54* 57*  CREATININE 2.70* 2.90*  CALCIUM  --  9.5   CBC:  Recent Labs  07/21/14 1937 07/21/14 2020  WBC  --  12.9*  NEUTROABS  --  10.5*  HGB 10.5* 10.1*  HCT 31.0* 30.5*  MCV  --  87.9  PLT  --  256   Urinalysis:  Recent Labs  07/19/14 1757 07/21/14 2130  COLORURINE YELLOW YELLOW  LABSPEC 1.021 1.027  PHURINE 5.0 5.0  GLUCOSEU NEGATIVE NEGATIVE  HGBUR TRACE* NEGATIVE  BILIRUBINUR NEGATIVE SMALL*  KETONESUR NEGATIVE 15*  PROTEINUR >300* >300*  UROBILINOGEN 0.2 0.2  NITRITE NEGATIVE NEGATIVE  LEUKOCYTESUR TRACE* NEGATIVE  few bacteria, hyaline casts  Imaging results:  Ct Head Wo Contrast  07/21/2014   CLINICAL DATA:  Left sided weakness.  EXAM: CT HEAD WITHOUT CONTRAST  TECHNIQUE: Contiguous axial images were obtained from the base of the skull through the vertex without intravenous contrast.  COMPARISON:  07/16/2014  FINDINGS: Ventricles normal in overall configuration. There is  ventricular and sulcal enlargement reflecting moderate atrophy. Several small lacune infarcts are noted in the basal ganglia and central white matter. Patchy white matter hypoattenuation is noted elsewhere consistent with chronic microvascular ischemic change.  No parenchymal masses or mass effect. There is no evidence of a recent cortical infarct.  There are no extra-axial masses or abnormal fluid collections.  There is no intracranial hemorrhage.  Visualized sinuses and mastoid air cells are clear. No skull lesion.  IMPRESSION: 1. No acute intracranial abnormalities. 2. No change from the recent prior CT  scan the brain.   Electronically Signed   By: Amie Portland M.D.   On: 07/21/2014 22:33    Other results: EKG: NSR, no t-wave inversions or ST changes concerning for ischemia, no prior for comparison.  Assessment & Plan by Problem: Active Problems:   CVA (cerebral vascular accident)   Stroke-like symptoms  #CVA: The history was very limited by Mr Mcgillis's dementia. He does have weakness of the LLE on exam and asymmetric strength in the LUE. No tpa was given by ED due to unknown time frame. While patient said he might have had stroke in the past when questioned, he is unreliable. We will attempt to contact [facility] nursing home for further history. In the meantime, he is to be seen by neurology and will receive stroke work-up. He already received ASA 300 mg pr in the ED. -f/u neuro recs -MRI/MRA head brain wo contrast -2d echo w contrast -carotid dopplers -check hemoglobin A1c, lipid panel -hold antihypertensives for permissive hypertension -ASA 325 mg po daily -NPO until pass swallow screen -PT/OT/SLP -neuro checks q2h -cardiac monitoring  #Leukocytosis: Presenting WBC count of 12.9 with no previous in our EMR. Unclear cause as his other SIRS criteria are negative. It amy be reactive from his falls. His ROS was negative but he is unreliable historian. UA nitrite and LE negative with few  bacteria but denies dysuria. Lungs sounded clear anteriorly but he was uncooperative with exam -recheck CBC in am -CXR -BCx x 2 if he spikes a fever or becomes hemodynamically unstable  #Acute versus chronic renal disease: Presenting creatinine of 2.90, BUN 57, and GFR 20. We do not have any baseline renal function on him. He did appear dry on exam. While he denied any issues with appetite or swallowing, he may be dehydrated due to malnutrition given ketones in urine. -repeat BMP in am -unknown heart function so start NS @ 75 cc/hr x 8 h then re-evaluate after echo  #DM2: No hemoglobin A1c in our record. Presenting glucose of 173. At nursing home he is on metformin 1000 mg bid, januvia 100 mg daily, and lantus 16 u qhs -hold metformin 1000 mg bid, januvia 100 mg daily, and lantus 16 u qhs (no basal for now) -SSI-moderate  #HTN: Presenting BP of 130/61 with BP in the 120s-140s/60s. At nursing home he is on carvedilol 12.5 mg bid, losartan-HCTZ 100-12.5 mg daily -hold carvedilol 12.5 mg bid, losartan-HCTZ 100-12.5 mg daily for permissive hypertension  #Anemia: Hemoglobin 10.1 on presentation with no prior. He takes ferrous sulfate 325 mg bid at nursing home for presumed iron deficiency -cont to monitor and consider further work-up if drop in hemoglobin.  #Advanced Dementia: Mr Stenzel was oriented to person but did not know where he was or the date. He did know his birth date. He got frustrated with all of our questions. He is currently on donepezil 10 mg daily -hold donepezil 10 mg daily for now  #Diet: NPO pending swallow screen  #DVT PPx: heparin 5000 u [place] tid  #Code: full  Dispo: Disposition is deferred at this time, awaiting improvement of current medical problems. Anticipated discharge in approximately 1 day(s).   The patient does have a current PCP (Ron Parker, MD) and does need an [facility] hospital follow-up appointment after discharge.  The patient does have transportation limitations  that hinder transportation to clinic appointments.  Signed: Lorenda Hatchet, MD 07/22/2014, 12:34 AM

## 2014-07-21 NOTE — ED Provider Notes (Signed)
CSN: [id number]     Arrival date & time 07/21/14  1918 History   First MD Initiated Contact with Patient 07/21/14 1936     Chief Complaint  Patient presents with  . Stroke Symptoms     (Consider location/radiation/quality/duration/timing/severity/associated sxs/prior Treatment) HPI   73yM with L sided weakness. First noticed by staff around 1200 today. Unsure when last normal. Only offered complaint is of chest pain which he has had for several days. Seen in ED a two days ago after fall and having CP. XR then showed acute right fourth through ninth rib fracture. Denies denies new trauma/fall.  When asked specifically if his L leg feels weak, he says it does. Denies weakness in LUE although he clearly has it on exam. Has history of dementia and unfortunately he is not the most reliable historian.   Past Medical History  Diagnosis Date  . Dementia   . Diabetes mellitus without complication    History reviewed. No pertinent past surgical history. No family history on file. History  Substance Use Topics  . Smoking status: Current Every Day Smoker -- 1.00 packs/day    Types: Cigarettes  . Smokeless tobacco: Not on file  . Alcohol Use: No    Review of Systems  All systems reviewed and negative, other than as noted in HPI.   Allergies  Review of patient's allergies indicates no known allergies.  Home Medications   Prior to Admission medications   Medication Sig Start Date End Date Taking? Authorizing Provider  acetaminophen (TYLENOL) 325 MG tablet Take 650 mg by mouth 4 (four) times daily.   Yes Historical Provider, MD  alum & mag hydroxide-simeth (MAALOX/MYLANTA) 200-200-20 MG/5ML suspension Take 30 mLs by mouth every 6 (six) hours as needed for indigestion or heartburn.   Yes Historical Provider, MD  carvedilol (COREG) 12.5 MG tablet Take 12.5 mg by mouth 2 (two) times daily.   Yes Historical Provider, MD  divalproex (DEPAKOTE) 250 MG DR tablet Take 250 mg by mouth 2 (two)  times daily.   Yes Historical Provider, MD  donepezil (ARICEPT) 10 MG tablet Take 10 mg by mouth daily.   Yes Historical Provider, MD  DULoxetine (CYMBALTA) 30 MG capsule Take 30 mg by mouth daily.   Yes Historical Provider, MD  ferrous sulfate 325 (65 FE) MG tablet Take 325 mg by mouth 2 (two) times daily.   Yes Historical Provider, MD  guaifenesin (ROBITUSSIN) 100 MG/5ML syrup Take 200 mg by mouth every 6 (six) hours as needed for cough.   Yes Historical Provider, MD  insulin glargine (LANTUS) 100 UNIT/ML injection Inject 16 Units into the skin at bedtime.   Yes Historical Provider, MD  latanoprost (XALATAN) 0.005 % ophthalmic solution Place 1 drop into both eyes at bedtime.   Yes Historical Provider, MD  LORazepam (ATIVAN) 0.5 MG tablet Take 0.5 mg by mouth 3 (three) times daily.   Yes Historical Provider, MD  losartan-hydrochlorothiazide (HYZAAR) 100-12.5 MG per tablet Take 1 tablet by mouth daily.   Yes Historical Provider, MD  magnesium hydroxide (MILK OF MAGNESIA) 400 MG/5ML suspension Take 30 mLs by mouth at bedtime as needed for mild constipation.   Yes Historical Provider, MD  metFORMIN (GLUCOPHAGE) 1000 MG tablet Take 1,000 mg by mouth 2 (two) times daily.   Yes Historical Provider, MD  pantoprazole (PROTONIX) 40 MG tablet Take 40 mg by mouth 2 (two) times daily.   Yes Historical Provider, MD  sertraline (ZOLOFT) 25 MG tablet Take 75 mg by mouth  daily.   Yes Historical Provider, MD  sitaGLIPtin (JANUVIA) 100 MG tablet Take 100 mg by mouth daily.   Yes Historical Provider, MD  thiamine (VITAMIN B-1) 100 MG tablet Take 100 mg by mouth daily.   Yes Historical Provider, MD  traMADol-acetaminophen (ULTRACET) 37.5-325 MG per tablet Take 1 tablet by mouth every 6 (six) hours as needed. Patient taking differently: Take 1 tablet by mouth every 6 (six) hours as needed for moderate pain.  07/19/14  Yes Flint MelterElliott L Wentz, MD  traZODone (DESYREL) 50 MG tablet Take 50 mg by mouth at bedtime.   Yes  Historical Provider, MD  Vitamin D, Ergocalciferol, (DRISDOL) 50000 UNITS CAPS capsule Take 50,000 Units by mouth every Saturday.    Yes Historical Provider, MD  loperamide (IMODIUM) 2 MG capsule Take 2 mg by mouth as needed for diarrhea or loose stools.    Historical Provider, MD  neomycin-bacitracin-polymyxin (NEOSPORIN) [phone] ointment Apply 1 application topically as needed (for skin tears or abrasions).    Historical Provider, MD   BP 137/66 mmHg  Pulse 63  Temp(Src) 97.6 F (36.4 C) (Oral)  Resp 21  Ht 5\' 11"  (1.803 m)  Wt 195 lb (88.451 kg)  BMI 27.21 kg/m2  SpO2 95% Physical Exam  Constitutional: He appears well-developed and well-nourished. No distress.  HENT:  Head: Normocephalic and atraumatic.  Eyes: Conjunctivae are normal. Pupils are equal, round, and reactive to light. Right eye exhibits no discharge. Left eye exhibits no discharge.  Neck: Neck supple.  Cardiovascular: Normal rate, regular rhythm and normal heart sounds.  Exam reveals no gallop and no friction rub.   No murmur heard. Pulmonary/Chest: Effort normal and breath sounds normal. No respiratory distress.  Abdominal: Soft. He exhibits no distension. There is no tenderness.  Musculoskeletal: He exhibits no edema or tenderness.  Neurological: He is alert. He exhibits normal muscle tone.  Speech clear. Follows commands. Disoriented to time. CN intact. Strength 3/5 L u/l extremity. Sensation intact to light touch. Good finger to nose good on R. Limited assessment on L because of weakness.   Skin: Skin is warm and dry.  Psychiatric: He has a normal mood and affect. His behavior is normal. Thought content normal.  Nursing note and vitals reviewed.   ED Course  Procedures (including critical care time) Labs Review Labs Reviewed  BASIC METABOLIC PANEL - Abnormal; Notable for the following:    Glucose, Bld 173 (*)    BUN 57 (*)    Creatinine, Ser 2.90 (*)    GFR calc non Af Amer 20 (*)    GFR calc Af Amer  23 (*)    All other components within normal limits  CBC WITH DIFFERENTIAL - Abnormal; Notable for the following:    WBC 12.9 (*)    RBC 3.47 (*)    Hemoglobin 10.1 (*)    HCT 30.5 (*)    Neutrophils Relative % 82 (*)    Neutro Abs 10.5 (*)    All other components within normal limits  I-STAT CHEM 8, ED - Abnormal; Notable for the following:    BUN 54 (*)    Creatinine, Ser 2.70 (*)    Glucose, Bld 177 (*)    Hemoglobin 10.5 (*)    HCT 31.0 (*)    All other components within normal limits  URINALYSIS, ROUTINE W REFLEX MICROSCOPIC    Imaging Review Ct Head Wo Contrast  07/21/2014   CLINICAL DATA:  Left sided weakness.  EXAM: CT HEAD WITHOUT CONTRAST  TECHNIQUE: Contiguous axial images were obtained from the base of the skull through the vertex without intravenous contrast.  COMPARISON:  07/16/2014  FINDINGS: Ventricles normal in overall configuration. There is ventricular and sulcal enlargement reflecting moderate atrophy. Several small lacune infarcts are noted in the basal ganglia and central white matter. Patchy white matter hypoattenuation is noted elsewhere consistent with chronic microvascular ischemic change.  No parenchymal masses or mass effect. There is no evidence of a recent cortical infarct.  There are no extra-axial masses or abnormal fluid collections.  There is no intracranial hemorrhage.  Visualized sinuses and mastoid air cells are clear. No skull lesion.  IMPRESSION: 1. No acute intracranial abnormalities. 2. No change from the recent prior CT scan the brain.   Electronically Signed   By: Amie Portland M.D.   On: 07/21/2014 22:33     EKG Interpretation None      MDM   Final diagnoses:  Left-sided weakness  Renal impairment    73yM with L sided weakness. Not tpa candidate because last seen normal not clear. Additionally, Cr elevated. Unsure of chronicity. Likely some degree of pre-renal with elevation of BUN to this degree.     Raeford Razor, MD 07/22/14  0005

## 2014-07-21 NOTE — ED Notes (Signed)
Attempted report 

## 2014-07-21 NOTE — ED Notes (Signed)
[facility] Staff reports that pt has been to the hospital several times over the past week. States that he fell Friday and was diagnosed with fractured ribs. Staff reports that around noon he was helped into a wheelchair and they noticed he was dragging his left leg. Staff noticed the same at 1500 when they helped pt to the bathroom. Staff is unsure of last known well.

## 2014-07-22 ENCOUNTER — Encounter (HOSPITAL_COMMUNITY): Payer: Self-pay | Admitting: General Practice

## 2014-07-22 ENCOUNTER — Observation Stay (HOSPITAL_COMMUNITY): Payer: Medicare Other

## 2014-07-22 DIAGNOSIS — D72829 Elevated white blood cell count, unspecified: Secondary | ICD-10-CM

## 2014-07-22 DIAGNOSIS — E119 Type 2 diabetes mellitus without complications: Secondary | ICD-10-CM

## 2014-07-22 DIAGNOSIS — I6789 Other cerebrovascular disease: Secondary | ICD-10-CM

## 2014-07-22 DIAGNOSIS — N189 Chronic kidney disease, unspecified: Secondary | ICD-10-CM

## 2014-07-22 DIAGNOSIS — R531 Weakness: Secondary | ICD-10-CM | POA: Insufficient documentation

## 2014-07-22 DIAGNOSIS — M6289 Other specified disorders of muscle: Secondary | ICD-10-CM

## 2014-07-22 DIAGNOSIS — I639 Cerebral infarction, unspecified: Secondary | ICD-10-CM

## 2014-07-22 DIAGNOSIS — E785 Hyperlipidemia, unspecified: Secondary | ICD-10-CM

## 2014-07-22 DIAGNOSIS — N179 Acute kidney failure, unspecified: Secondary | ICD-10-CM

## 2014-07-22 DIAGNOSIS — I120 Hypertensive chronic kidney disease with stage 5 chronic kidney disease or end stage renal disease: Secondary | ICD-10-CM

## 2014-07-22 DIAGNOSIS — E1159 Type 2 diabetes mellitus with other circulatory complications: Secondary | ICD-10-CM | POA: Insufficient documentation

## 2014-07-22 DIAGNOSIS — F0390 Unspecified dementia without behavioral disturbance: Secondary | ICD-10-CM | POA: Insufficient documentation

## 2014-07-22 DIAGNOSIS — R2990 Unspecified symptoms and signs involving the nervous system: Secondary | ICD-10-CM | POA: Diagnosis present

## 2014-07-22 DIAGNOSIS — I10 Essential (primary) hypertension: Secondary | ICD-10-CM

## 2014-07-22 DIAGNOSIS — D649 Anemia, unspecified: Secondary | ICD-10-CM

## 2014-07-22 LAB — LIPID PANEL
CHOL/HDL RATIO: 5.3 ratio
Cholesterol: 180 mg/dL (ref 0–200)
HDL: 34 mg/dL — ABNORMAL LOW (ref >39)
LDL Cholesterol: 108 mg/dL — ABNORMAL HIGH (ref 0–99)
Triglycerides: 191 mg/dL — ABNORMAL HIGH (ref <150)
VLDL: 38 mg/dL (ref 0–40)

## 2014-07-22 LAB — BASIC METABOLIC PANEL
Anion gap: 9 (ref 5–15)
BUN: 57 mg/dL — ABNORMAL HIGH (ref 6–23)
CHLORIDE: 109 meq/L (ref 96–112)
CO2: 24 mmol/L (ref 19–32)
CREATININE: 2.56 mg/dL — AB (ref 0.50–1.35)
Calcium: 9.4 mg/dL (ref 8.4–10.5)
GFR calc Af Amer: 27 mL/min — ABNORMAL LOW (ref >90)
GFR, EST NON AFRICAN AMERICAN: 23 mL/min — AB (ref >90)
GLUCOSE: 99 mg/dL (ref 70–99)
POTASSIUM: 3.9 mmol/L (ref 3.5–5.1)
Sodium: 142 mmol/L (ref 135–145)

## 2014-07-22 LAB — CBC
HCT: 29.2 % — ABNORMAL LOW (ref 39.0–52.0)
Hemoglobin: 9.7 g/dL — ABNORMAL LOW (ref 13.0–17.0)
MCH: 29.5 pg (ref 26.0–34.0)
MCHC: 33.2 g/dL (ref 30.0–36.0)
MCV: 88.8 fL (ref 78.0–100.0)
Platelets: 233 10*3/uL (ref 150–400)
RBC: 3.29 MIL/uL — AB (ref 4.22–5.81)
RDW: 14.6 % (ref 11.5–15.5)
WBC: 9 10*3/uL (ref 4.0–10.5)

## 2014-07-22 LAB — GLUCOSE, CAPILLARY
GLUCOSE-CAPILLARY: 80 mg/dL (ref 70–99)
Glucose-Capillary: 83 mg/dL (ref 70–99)
Glucose-Capillary: 109 mg/dL — ABNORMAL HIGH (ref 70–99)
Glucose-Capillary: 183 mg/dL — ABNORMAL HIGH (ref 70–99)
Glucose-Capillary: 183 mg/dL — ABNORMAL HIGH (ref 70–99)

## 2014-07-22 LAB — HEMOGLOBIN A1C
HEMOGLOBIN A1C: 6 % — AB (ref <5.7)
MEAN PLASMA GLUCOSE: 126 mg/dL — AB (ref <117)

## 2014-07-22 LAB — MAGNESIUM: MAGNESIUM: 2 mg/dL (ref 1.5–2.5)

## 2014-07-22 MED ORDER — STARCH (THICKENING) PO POWD
ORAL | Status: DC | PRN
  Filled 2014-07-22: qty 227

## 2014-07-22 MED ORDER — ASPIRIN 325 MG PO TABS
325.0000 mg | ORAL_TABLET | Freq: Every day | ORAL | Status: DC
  Administered 2014-07-23 – 2014-07-26 (×4): 325 mg via ORAL
  Filled 2014-07-22 (×4): qty 1

## 2014-07-22 MED ORDER — SODIUM CHLORIDE 0.9 % IV SOLN
INTRAVENOUS | Status: AC
  Administered 2014-07-22: 02:00:00 via INTRAVENOUS

## 2014-07-22 MED ORDER — INSULIN ASPART 100 UNIT/ML ~~LOC~~ SOLN
0.0000 [IU] | Freq: Three times a day (TID) | SUBCUTANEOUS | Status: DC
  Administered 2014-07-23: 3 [IU] via SUBCUTANEOUS
  Administered 2014-07-23: 5 [IU] via SUBCUTANEOUS
  Administered 2014-07-24: 8 [IU] via SUBCUTANEOUS
  Administered 2014-07-24: 2 [IU] via SUBCUTANEOUS
  Administered 2014-07-24 – 2014-07-25 (×3): 5 [IU] via SUBCUTANEOUS
  Administered 2014-07-25: 8 [IU] via SUBCUTANEOUS
  Administered 2014-07-26 (×2): 5 [IU] via SUBCUTANEOUS
  Administered 2014-07-26: 3 [IU] via SUBCUTANEOUS

## 2014-07-22 MED ORDER — INSULIN ASPART 100 UNIT/ML ~~LOC~~ SOLN
0.0000 [IU] | SUBCUTANEOUS | Status: DC
  Administered 2014-07-22: 3 [IU] via SUBCUTANEOUS

## 2014-07-22 MED ORDER — LORAZEPAM 0.5 MG PO TABS
0.5000 mg | ORAL_TABLET | Freq: Three times a day (TID) | ORAL | Status: DC
  Administered 2014-07-22 – 2014-07-26 (×12): 0.5 mg via ORAL
  Filled 2014-07-22 (×12): qty 1

## 2014-07-22 MED ORDER — ASPIRIN 300 MG RE SUPP
300.0000 mg | Freq: Every day | RECTAL | Status: DC
  Administered 2014-07-22: 300 mg via RECTAL
  Filled 2014-07-22: qty 1

## 2014-07-22 MED ORDER — DONEPEZIL HCL 10 MG PO TABS
10.0000 mg | ORAL_TABLET | Freq: Every day | ORAL | Status: DC
  Administered 2014-07-23 – 2014-07-26 (×4): 10 mg via ORAL
  Filled 2014-07-22 (×4): qty 1

## 2014-07-22 MED ORDER — STROKE: EARLY STAGES OF RECOVERY BOOK
Freq: Once | Status: AC
  Administered 2014-07-22

## 2014-07-22 MED ORDER — HEPARIN SODIUM (PORCINE) 5000 UNIT/ML IJ SOLN
5000.0000 [IU] | Freq: Three times a day (TID) | INTRAMUSCULAR | Status: DC
  Administered 2014-07-22 – 2014-07-26 (×14): 5000 [IU] via SUBCUTANEOUS
  Filled 2014-07-22 (×13): qty 1

## 2014-07-22 MED ORDER — ATORVASTATIN CALCIUM 80 MG PO TABS
80.0000 mg | ORAL_TABLET | Freq: Every day | ORAL | Status: DC
  Administered 2014-07-22: 80 mg via ORAL
  Filled 2014-07-22: qty 1

## 2014-07-22 MED ORDER — ACETAMINOPHEN 325 MG PO TABS
650.0000 mg | ORAL_TABLET | Freq: Four times a day (QID) | ORAL | Status: DC | PRN
  Administered 2014-07-22 – 2014-07-25 (×5): 650 mg via ORAL
  Filled 2014-07-22 (×5): qty 2

## 2014-07-22 MED ORDER — SENNOSIDES-DOCUSATE SODIUM 8.6-50 MG PO TABS: 1.0000 | ORAL_TABLET | Freq: Every evening | ORAL | Status: DC | PRN

## 2014-07-22 NOTE — Progress Notes (Signed)
  Echocardiogram 2D Echocardiogram has been performed.  Arvil ChacoFoster, Shamir Tuzzolino 07/22/2014, 12:07 PM

## 2014-07-22 NOTE — Progress Notes (Signed)
STROKE TEAM PROGRESS NOTE   HISTORY Barry Buckley is an 74 y.o. male, right handed, with a past medical history significant for DM type II and dementia, [facility] evaluation of left sided weakness.  Patient has dementia and can not reliably contribute to his clinical information, hence all history was obtained from the chart: " he fell Friday 07/16/2014 and was diagnosed with fractured ribs. Staff reports that around noon on he was helped into a wheelchair and they noticed he was dragging his left leg. Staff noticed the same at 1500 when they helped pt to the bathroom. Staff is unsure of last known well".  " L sided weakness. First noticed by staff around 1200 today 07/21/2014. Unsure when last normal. Only offered complaint is of chest pain which he has had for several days. Seen in ED a two days ago after fall and having CP."  Patient denies having left sided weakness, HA, vertigo, double vision, slurred speech, language or vision impairment. CT brain tonight reviewed showed no acute abnormality. Slight leukocytosis, Cr. 2.90. Patient was not administered TPA secondary to unknown time of onset. He was admitted for further evaluation and treatment.   SUBJECTIVE (INTERVAL HISTORY) No family is at the bedside.  Overall he feels his condition is gradually improving. He does report he had some problems yesterday, but he can't remember what they were.   OBJECTIVE Temp:  [97.5 F (36.4 C)-98.1 F (36.7 C)] 97.7 F (36.5 C) (01/21 1200) Pulse Rate:  [57-74] 62 (01/21 1200) Cardiac Rhythm:  [-] Normal sinus rhythm;Sinus bradycardia (01/21 0015) Resp:  [15-21] 18 (01/21 1200) BP: (122-167)/(58-96) 167/96 mmHg (01/21 1200) SpO2:  [93 %-100 %] 99 % (01/21 1200) Weight:  [185 lb 3.2 oz (84.006 kg)-195 lb (88.451 kg)] 185 lb 3.2 oz (84.006 kg) (01/20 2301)   Recent Labs Lab 07/22/14 0404 07/22/14 0802 07/22/14 1312  GLUCAP 80 83 109*    Recent Labs Lab  07/21/14 1937 07/21/14 2020 07/22/14 0749  NA 141 139 142  K 4.1 4.0 3.9  CL 108 105 109  CO2  --  21 24  GLUCOSE 177* 173* 99  BUN 54* 57* 57*  CREATININE 2.70* 2.90* 2.56*  CALCIUM  --  9.5 9.4  MG  --   --  2.0   No results for input(s): AST, ALT, ALKPHOS, BILITOT, PROT, ALBUMIN in the last 168 hours.  Recent Labs Lab 07/21/14 1937 07/21/14 2020 07/22/14 0749  WBC  --  12.9* 9.0  NEUTROABS  --  10.5*  --   HGB 10.5* 10.1* 9.7*  HCT 31.0* 30.5* 29.2*  MCV  --  87.9 88.8  PLT  --  256 233   No results for input(s): CKTOTAL, CKMB, CKMBINDEX, TROPONINI in the last 168 hours. No results for input(s): LABPROT, INR in the last 72 hours.  Recent Labs  07/19/14 1757 07/21/14 2130  COLORURINE YELLOW YELLOW  LABSPEC 1.021 1.027  PHURINE 5.0 5.0  GLUCOSEU NEGATIVE NEGATIVE  HGBUR TRACE* NEGATIVE  BILIRUBINUR NEGATIVE SMALL*  KETONESUR NEGATIVE 15*  PROTEINUR >300* >300*  UROBILINOGEN 0.2 0.2  NITRITE NEGATIVE NEGATIVE  LEUKOCYTESUR TRACE* NEGATIVE       Component Value Date/Time   CHOL 180 07/22/2014 0530   TRIG 191* 07/22/2014 0530   HDL 34* 07/22/2014 0530   CHOLHDL 5.3 07/22/2014 0530   VLDL 38 07/22/2014 0530   LDLCALC 108* 07/22/2014 0530   No results found for: HGBA1C No results found for: LABOPIA,  COCAINSCRNUR, LABBENZ, AMPHETMU, THCU, LABBARB  No results for input(s): ETH in the last 168 hours.  Dg Chest 2 View  07/22/2014    1. Similar appearance of the chest radiograph to the prior exam. 2. Are right lower lobe opacity best seen posteriorly on the lateral view, is likely atelectasis. Pneumonia should be considered if there are consistent clinical symptoms. 3. Mild interstitial prominence is likely chronic. Consider mild interstitial edema if there shortness of breath.     Ct Head Wo Contrast   07/21/2014    1. No acute intracranial abnormalities. 2. No change from the recent prior CT scan the brain.     Mr Brain Wo Contrast  07/22/2014    Acute  RIGHT thalamus/internal capsule lacunar infarct.  Remote bilateral basal ganglia, thalamus lacunar infarcts. Remote pontine and cerebellar small infarcts. Moderate to severe white matter changes consistent with chronic small vessel ischemic disease.  Mild thinning of the midbrain, in a background of moderate to severe global parenchymal brain volume loss.    Mr Maxine Glenn Head/brain Wo Cm  07/22/2014   No large vessel occlusion.  Luminal irregularity of the RIGHT vertebral artery may reflect chronic dissection without stenosis, possible infundibulum.  Luminal irregularity of the cerebral arteries most consistent with atherosclerosis with mid high-grade stenosis RIGHT M3/4 branch and distal RIGHT A2.     CUS - Bilateral: 1-39% ICA stenosis. Vertebral artery flow is antegrade.  2D echo - pending   PHYSICAL EXAM  Temp:  [97.5 F (36.4 C)-98.1 F (36.7 C)] 97.7 F (36.5 C) (01/21 1200) Pulse Rate:  [57-74] 62 (01/21 1200) Resp:  [15-21] 18 (01/21 1200) BP: (122-167)/(58-96) 167/96 mmHg (01/21 1200) SpO2:  [93 %-100 %] 99 % (01/21 1200) Weight:  [185 lb 3.2 oz (84.006 kg)-195 lb (88.451 kg)] 185 lb 3.2 oz (84.006 kg) (01/20 2301)  General - Well nourished, well developed, in no apparent distress.  Ophthalmologic - not able to see through due to small pupils.  Cardiovascular - Regular rate and rhythm with no murmur.  Mental Status -  Level of arousal and orientation to month, place, and president were intact, but not to year. Language including expression, naming, repetition, comprehension was assessed and found intact. Recent and remote memory were 3/3 registration and 1/3 delayed recall. Fund of Knowledge was assessed and was impaired, did not know previous presidents.  Cranial Nerves II - XII - II - Visual field intact OU. III, IV, VI - Extraocular movements intact. V - Facial sensation intact bilaterally. VII - Facial movement intact bilaterally. VIII - Hearing & vestibular intact  bilaterally. X - Palate elevates symmetrically. XI - Chin turning & shoulder shrug intact bilaterally. XII - Tongue protrusion intact.  Motor Strength - The patient's strength was 5/5 RUE and RLE, but 4/5 LUE and LLE and pronator drift was on the left.  Bulk was normal and fasciculations were absent.   Motor Tone - Muscle tone was assessed at the neck and appendages and was normal.  Reflexes - The patient's reflexes were 1+ in all extremities and he had no pathological reflexes.  Sensory - Light touch, temperature/pinprick were assessed and were normal.    Coordination - The patient had normal movements in the hands with no ataxia or dysmetria.  Tremor was absent.  Gait and Station - not tested due to weakness and safety concerns.   ASSESSMENT/PLAN Barry Buckley is a 74 y.o. male with history of dementia, DM2, HTN who presented from [facility] nursing home with  L sided weakness. He did not receive IV t-PA  due to delay in arrival.   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 Continue ASA as outpt.  Because anterior choroidal artery is one of the branches of ICA, therefore cardioembloic etiology for AchA infarct is possible. We recommend to have out pt 30 day cardiac event monitoring to rule out Afib.   Ongoing aggressive stroke risk factor management  Therapy  recommendations:  Pending. Ok to be OOB from stroke standpoint  Disposition:  Return to SNF  Hypertension  Home meds - coreg, losartan and HCTZ  BP 122-156/67-74 past 24h (07/22/2014 @ 1:54 PM)   Stable  Permissive hypertension (OK if <220/120) for 24-48 hours post stroke and then gradually normalized within 5-7 days.  Hyperlipidemia  Home meds:  No statin  LDL 108, goal < 70  Recommend to add statin such as lipitor  to start with.  Diabetes  Home meds - insulin and metformin  HgbA1c pending, goal < 7.0  SSI  Close monitor  Dementina  Delayed recall 1/3  On aricept  MRI did not show CAA pattern  Other Stroke Risk Factors  Advanced age  Cigarette smoker, advised to stop smoking  Other Active Problems  Baseline advanced dementia, in nursing facility PTA  Leukocytosis, resolved  Acute vs chronic renal disease, Cr 2.56  anemia  Hospital day # 1  Rhoderick Moody [facility] See Amion for Pager information 07/22/2014 9:24 AM   I, the attending vascular neurologist, have personally obtained a history, examined the patient, evaluated laboratory data, individually viewed imaging studies and agree with radiology interpretations.  Together with the NP/PA, we formulated the assessment and plan of care which reflects our mutual decision.  I have made any additions or clarifications directly to the above note and agree with the findings and plan as currently documented.   74 yo M with hx of HTN, HLD, DM, dementia presented with right PLIC stroke. Distribution consistent with right anterior choroidal artery infarct. The etiology for this stroke is diverse, could be large vessel vs. Small vessel vs. Embolic pattern. Due to the anatomy of this artery, cardioembolic stroke not able to rule out easily. We recommend continue ASA and add statin and consider out pt 30 day cardiac event monitoring. Aggressive stroke risk factor modification for HTN, DM and HLD. Quit  smoking.  Neurology will sign off. Please call with questions. Pt will follow up with Dr. Roda Shutters at [facility] in about 2 months. Thanks for the consult.  Marvel Plan, MD PhD Stroke Neurology 07/22/2014 2:08 PM       To contact Stroke Continuity provider, please refer to [email or web address]. After hours, contact General Neurology

## 2014-07-22 NOTE — Clinical Social Work Note (Signed)
Clinical Social Worker has assessed pt and pt's family. Full psychosocial to follow.  Derenda FennelBashira Isaac Lacson, MSW, LCSWA [phone] 07/22/2014 5:26 PM

## 2014-07-22 NOTE — Progress Notes (Signed)
OT Cancellation Note  Patient Details Name: Barry MattocksJimmy Harren MRN: [id number] DOB: 12-31-1940   Cancelled Treatment:    Reason Eval/Treat Not Completed: Medical issues which prohibited therapy - pt currently with bedrest orders.  Will initiate OT eval once activity orders increased.   Angelene GiovanniConarpe, Makinley Muscato M  Vermelle Cammarata Hastingsonarpe, OTR/L [phone]   07/22/2014, 10:21 AM

## 2014-07-22 NOTE — Progress Notes (Signed)
UR completed 

## 2014-07-22 NOTE — Evaluation (Signed)
Physical Therapy Evaluation Patient Details Name: Barry Buckley MRN: [id number] DOB: January 21, 1941 Today's Date: 07/22/2014   History of Present Illness  This 74 y.o. admitted from [facility] with Lt sided weakness.  MRI revealed Rt. Thalamus/internal lacuner infarct.  Remote bil. basal ganglia, thalamus lacunar infarcts; remote pontine and cerebellar small infarcts.  PMH includes;  dementia, DM; acute on chronic renal disease; HTN    Clinical Impression  Patient presents with functional limitations due to deficits listed in PT problem list (see below). Pt with weakness LUE/LLE, baseline cognitive deficits, pain, balance deficits and impaired trunk control impacting safe mobility. Pt from ALF PTA and difficult to obtain PLOF secondary to hx of dementia. Attempted to call facility but not able to reach anyone. Based on decreased mobility and safety concerns, pt will likely need higher level of care and might benefit from ST SNF pending on level of assist facility can provide at ALF.      Follow Up Recommendations SNF;Supervision/Assistance - 24 hour    Equipment Recommendations  None recommended by PT    Recommendations for Other Services       Precautions / Restrictions Precautions Precautions: Fall Restrictions Weight Bearing Restrictions: No      Mobility  Bed Mobility               General bed mobility comments: Received sitting in chair upon PT arrival.   Transfers Overall transfer level: Needs assistance Equipment used: Rolling walker (2 wheeled) Transfers: Sit to/from Stand Sit to Stand: Mod assist         General transfer comment: Mod A to stand from chair with cues for hand placement/technique. "push that walker out of my way." Left lateral trunk lean however pt unable to state which direction he was leaning.  Ambulation/Gait Ambulation/Gait assistance: Mod assist Ambulation Distance (Feet): 5 Feet Assistive device: Rolling walker (2 wheeled) Gait  Pattern/deviations: Step-to pattern;Decreased stride length;Decreased step length - left;Decreased stance time - left;Shuffle     General Gait Details: Pt with severe left lateral trunk lean; able to correct with manual cues for upright. Difficulty advancing LLE requiring assist with weightshifting and advancement. Dragging LLE at times. Fearful of falling and complaints of back pain limiting distance.  Stairs            Wheelchair Mobility    Modified Rankin (Stroke Patients Only) Modified Rankin (Stroke Patients Only) Pre-Morbid Rankin Score: Moderately severe disability (unsure of premorbid PLOF) Modified Rankin: Moderately severe disability     Balance Overall balance assessment: Needs assistance Sitting-balance support: Feet supported;No upper extremity supported Sitting balance-Leahy Scale: Poor Sitting balance - Comments: LOB towards left side Postural control: Left lateral lean Standing balance support: During functional activity;Bilateral upper extremity supported Standing balance-Leahy Scale: Poor                               Pertinent Vitals/Pain Pain Assessment: Faces Faces Pain Scale: Hurts a little bit Pain Location: back Pain Descriptors / Indicators: Sore Pain Intervention(s): Limited activity within patient's tolerance;Monitored during session;Repositioned    Home Living Family/patient expects to be discharged to:: Skilled nursing facility     Type of Home: Assisted living           Additional Comments: [place].    Prior Function Level of Independence: Needs assistance   Gait / Transfers Assistance Needed: Pt unable to provide accurate info.  Per chart review, staff assisted pt with transfers  into w/c, but unsure if he was ambulatory also.   Per chart, pt with h/o falls. Pt reports walking but using w/c for mobility as well.  ADL's / Homemaking Assistance Needed: Unable to determine PLOF with ADLs.  Attempted to contact ALF,  but when she was transferred to someone familiar with pt, no one answered         Hand Dominance   Dominant Hand: Right    Extremity/Trunk Assessment   Upper Extremity Assessment: Defer to OT evaluation (Observed some dysmetria when trying to bring cup up to mouth using LUE.)       LUE Deficits / Details: Pt with full AROM, dysmetria noted    Lower Extremity Assessment: Generalized weakness;LLE deficits/detail   LLE Deficits / Details: Grossly ~2+/5 hip flexion, knee flexion, knee extension.      Communication   Communication: No difficulties  Cognition Arousal/Alertness: Awake/alert Behavior During Therapy: WFL for tasks assessed/performed Overall Cognitive Status: No family/caregiver present to determine baseline cognitive functioning                      General Comments      Exercises        Assessment/Plan    PT Assessment Patient needs continued PT services  PT Diagnosis Difficulty walking;Abnormality of gait;Generalized weakness;Hemiplegia non-dominant side   PT Problem List Decreased strength;Pain;Decreased range of motion;Decreased cognition;Decreased activity tolerance;Decreased balance;Decreased mobility;Decreased safety awareness  PT Treatment Interventions Balance training;Gait training;Neuromuscular re-education;Patient/family education;Functional mobility training;Therapeutic activities;Therapeutic exercise;Wheelchair mobility training   PT Goals (Current goals can be found in the Care Plan section) Acute Rehab PT Goals Patient Stated Goal: none stated PT Goal Formulation: With patient Time For Goal Achievement: 08/05/14 Potential to Achieve Goals: Fair    Frequency Min 3X/week   Barriers to discharge Decreased caregiver support Not sure of level of support at ALF.    Co-evaluation               End of Session Equipment Utilized During Treatment: Gait belt Activity Tolerance: Patient limited by pain;Other (comment) (fear of  falling) Patient left: in chair;with call bell/phone within reach;with chair alarm set Nurse Communication: Mobility status         Time: 1312-1330 PT Time Calculation (min) (ACUTE ONLY): 18 min   Charges:   PT Evaluation $Initial PT Evaluation Tier I: 1 Procedure     PT G CodesAlvie Buckley:        Buckley, Barry Feng A 07/22/2014, 2:36 PM Barry HeidelbergShauna Buckley, PT, DPT [phone]

## 2014-07-22 NOTE — Progress Notes (Signed)
*  PRELIMINARY RESULTS* Vascular Ultrasound Carotid Duplex (Doppler) has been completed.   Findings suggest 1-39% internal carotid artery stenosis bilaterally. Vertebral arteries are patent with antegrade flow.  07/22/2014 11:36 AM Gertie FeyMichelle Mia Milan, RVT, RDCS, RDMS

## 2014-07-22 NOTE — Evaluation (Signed)
Speech Language Pathology Evaluation Patient Details Name: Barry MattocksJimmy Candler MRN: [id number] DOB: [birth year] Today's Date: 07/22/2014 Time: [id number] SLP Time Calculation (min) (ACUTE ONLY): 9 min  Problem List:  Patient Active Problem List   Diagnosis Date Noted  . Stroke-like symptoms 07/22/2014  . CVA (cerebral vascular accident) 07/21/2014   Past Medical History:  Past Medical History  Diagnosis Date  . Dementia   . Diabetes mellitus without complication   . Hypertension    Past Surgical History:  Past Surgical History  Procedure Laterality Date  . Cholecystectomy     HPI:  Barry MattocksJimmy Klausing is a 74 y.o. male with PMH significant for DM type II and dementia, [facility] left sided weakness. MRI positive for acute RIGHT thalamus/internal capsule lacunar infarct.   Assessment / Plan / Recommendation Clinical Impression  Pt has a reported h/o dementia, although his baseline level of function is unknown at this time. He is currently oriented to person only, although with Mod cues from SLP, he is able to recall his location after almost 10 minutes. Pt requires Min-Mod cues for sustained attention to task and intellectual awareness. Will continue to f/u for cognitive strategies as baseline function is determined.    SLP Assessment  Patient needs continued Speech Lanaguage Pathology Services    Follow Up Recommendations  Skilled Nursing facility;24 hour supervision/assistance    Frequency and Duration min 2x/week  2 weeks   Pertinent Vitals/Pain Pain Assessment: No/denies pain   SLP Goals  Patient/Family Stated Goal: wants water Potential to Achieve Goals (ACUTE ONLY): Fair Potential Considerations (ACUTE ONLY): Previous level of function  SLP Evaluation Prior Functioning  Cognitive/Linguistic Baseline: Baseline deficits Baseline deficit details: pt with h/o dementia, baseline level of function unclear Type of Home: Skilled Nursing  Facility   Cognition  Overall Cognitive Status: No family/caregiver present to determine baseline cognitive functioning Arousal/Alertness: Awake/alert Orientation Level: Oriented to person;Disoriented to place;Disoriented to time;Disoriented to situation Attention: Sustained Sustained Attention: Impaired Sustained Attention Impairment: Functional basic Memory: Impaired Memory Impairment: Decreased recall of new information;Retrieval deficit Awareness: Impaired Awareness Impairment: Intellectual impairment;Emergent impairment;Anticipatory impairment Problem Solving: Impaired Problem Solving Impairment: Functional basic    Comprehension  Auditory Comprehension Overall Auditory Comprehension: Appears within functional limits for tasks assessed (with very basic tasks) Reading Comprehension Reading Status: Not tested    Expression Expression Primary Mode of Expression: Verbal Verbal Expression Overall Verbal Expression: Appears within functional limits for tasks assessed (during basic, functional conversation) Written Expression Written Expression: Not tested   Oral / Motor Oral Motor/Sensory Function Overall Oral Motor/Sensory Function: Impaired Facial Symmetry: Left droop Motor Speech Overall Motor Speech: Appears within functional limits for tasks assessed   GO Functional Assessment Tool Used: skilled clinical judgment Functional Limitations: Memory Memory Current Status (X8338(G9168): At least 40 percent but less than 60 percent impaired, limited or restricted Memory Goal Status (S5053(G9169): At least 20 percent but less than 40 percent impaired, limited or restricted    Maxcine HamLaura Paiewonsky, M.A. CCC-SLP [phone]  Maxcine Hamaiewonsky, Tersa Fotopoulos 07/22/2014, 11:27 AM

## 2014-07-22 NOTE — Evaluation (Signed)
Clinical/Bedside Swallow Evaluation Patient Details  Name: Barry MattocksJimmy Romas MRN: [id number] Date of Birth: [birth year]  Today's Date: 07/22/2014 Time: SLP Start Time (ACUTE ONLY): 1043 SLP Stop Time (ACUTE ONLY): 1053 SLP Time Calculation (min) (ACUTE ONLY): 10 min  Past Medical History:  Past Medical History  Diagnosis Date  . Dementia   . Diabetes mellitus without complication   . Hypertension    Past Surgical History:  Past Surgical History  Procedure Laterality Date  . Cholecystectomy     HPI:  Barry MattocksJimmy Kerman is a 74 y.o. male with PMH significant for DM type II and dementia, [facility] of left sided weakness. MRI positive for acute RIGHT thalamus/internal capsule lacunar infarct.   Assessment / Plan / Recommendation Clinical Impression  Pt is impulsive with rate of liquid intake, with resultant immediate coughing noted with thin liquids. SLP intervention for small, single sips reduced but did not eliminate overt signs of aspiration, however coughing ceased with nectar thick liquids and solids. Pt did have prolonged mastication with regular textures requiring liquid washes to clear residuals. Recommend Dys 3 diet and nectar thick liquids. Will continue to follow for tolerance and advancement.    Aspiration Risk  Moderate    Diet Recommendation Dysphagia 3 (Mechanical Soft);Nectar-thick liquid   Liquid Administration via: Cup;No straw Medication Administration: Whole meds with puree Supervision: Patient able to self feed;Full supervision/cueing for compensatory strategies Compensations: Slow rate;Small sips/bites;Follow solids with liquid (check for residue) Postural Changes and/or Swallow Maneuvers: Seated upright 90 degrees    Other  Recommendations Oral Care Recommendations: Oral care BID Other Recommendations: Order thickener from pharmacy;Prohibited food (jello, ice cream, thin soups);Remove water pitcher   Follow Up  Recommendations  Skilled Nursing facility    Frequency and Duration min 2x/week  2 weeks   Pertinent Vitals/Pain n/a    SLP Swallow Goals     Swallow Study Prior Functional Status       General Date of Onset: 07/21/14 HPI: Barry MattocksJimmy Harshfield is a 74 y.o. male with PMH significant for DM type II and dementia, [facility] sided weakness. MRI positive for acute RIGHT thalamus/internal capsule lacunar infarct. Type of Study: Bedside swallow evaluation Previous Swallow Assessment: none in chart, however suspect has been on thickened liquids previously as pt had a fairly strong reaction when discussing thickened liquids Diet Prior to this Study: NPO Respiratory Status: Nasal cannula History of Recent Intubation: No Behavior/Cognition: Alert;Cooperative;Confused;Pleasant mood;Requires cueing Oral Cavity - Dentition: Adequate natural dentition;Missing dentition Self-Feeding Abilities: Needs assist Patient Positioning: Upright in bed Baseline Vocal Quality: Clear Volitional Cough: Other (Comment) (pt would not cough, says it hurts too much) Volitional Swallow: Able to elicit    Oral/Motor/Sensory Function Overall Oral Motor/Sensory Function: Impaired Facial Symmetry: Left droop   Ice Chips Ice chips: Not tested   Thin Liquid Thin Liquid: Impaired Presentation: Cup;Self Fed;Straw Pharyngeal  Phase Impairments: Suspected delayed Swallow;Cough - Immediate    Nectar Thick Nectar Thick Liquid: Impaired Presentation: Cup;Self Fed Pharyngeal Phase Impairments: Suspected delayed Swallow   Honey Thick Honey Thick Liquid: Not tested   Puree Puree: Within functional limits Presentation: Self Fed;Spoon   Solid   GO Functional Assessment Tool Used: skilled clinical judgment Functional Limitations: Swallowing Swallow Current Status (W0981(G8996): At least 20 percent but less than 40 percent impaired, limited or restricted Swallow Goal Status  [phone]([id number]): At least 20 percent but less than 40 percent impaired, limited  or restricted  Solid: Impaired Presentation: Self Fed Oral Phase Impairments: Impaired mastication Oral Phase Functional Implications: Oral residue        Maxcine Ham, M.A. CCC-SLP [phone]  Maxcine Ham 07/22/2014,11:15 AM

## 2014-07-22 NOTE — Progress Notes (Addendum)
Subjective: Mr. Barry Buckley feels "fine" this morning. He would like to go home and is unsure why he is being treated for something. He shares that does not want to go back to his ALF, and would "like to not go back there." He is not in pain.  Discussion with daughter Barry Buckley): in agreement: would like him to have a higher level of care as outpatient.  Objective: Vital signs in last 24 hours: Filed Vitals:   07/22/14 0412 07/22/14 0553 07/22/14 0800 07/22/14 1000  BP: 122/58 132/61 147/74 145/81  Pulse: 66 57 60 64  Temp: 97.9 F (36.6 C) 97.5 F (36.4 C) 97.9 F (36.6 C) 97.7 F (36.5 C)  TempSrc: Axillary Oral Oral Oral  Resp: 17 18 18 20   Height:      Weight:      SpO2: 95% 100% 96% 98%   Weight change:  No intake or output data in the 24 hours ending 07/22/14 1216  Physical Exam: Appearance: in NAD, resting in bed, appears confused HEENT: AT/[place], PERRL, EOMi, sclerae anicteric, dry mucous membranes Heart: RRR, normal S1S2, no MRG, nontender Lungs: CTAB, normal work of breathing Abdomen: BS+, soft, nontender, nondistended, no organomegaly Extremities: no edema BLE Neurologic: A&Ox2 to person and place Ginette Otto", "hospital"), states the year is 45. No dysarthria, 5/5 strength in right upper extremity, 4/5 in LUE, 4/5 in RLE, 3/5 LLE, sensation grossly intact, patient very slow on F-N-F (especially slow and inaccurate on the left side), reflexes 2+ throughout, upgoing Babinski left, downgoing Babinski right I: smell Not tested  II: visual acuity  OS: na OD: na  II: visual fields full  II: pupils Equal, round, reactive to light  III,VII: ptosis None  III,IV,VI: extraocular muscles  Full ROM  V: mastication Normal  V: facial light touch sensation  Normal  V,VII: corneal reflex  Present  VII: facial muscle function - upper  Normal  VII: facial muscle function - lower Normal  VIII: hearing grossly intact b/l  IX: soft palate elevation  Normal   IX,X: gag reflex Present  XI: trapezius strength  5/5  XI: sternocleidomastoid strength 5/5  XI: neck flexion strength  5/5  XII: tongue strength  Normal  Skin: no rashes or lesions  Lab Results: Basic Metabolic Panel:  Recent Labs Lab 07/21/14 2020 07/22/14 0749  NA 139 142  K 4.0 3.9  CL 105 109  CO2 21 24  GLUCOSE 173* 99  BUN 57* 57*  CREATININE 2.90* 2.56*  CALCIUM 9.5 9.4  MG  --  2.0   CBC:  Recent Labs Lab 07/21/14 2020 07/22/14 0749  WBC 12.9* 9.0  NEUTROABS 10.5*  --   HGB 10.1* 9.7*  HCT 30.5* 29.2*  MCV 87.9 88.8  PLT 256 233   CBG:  Recent Labs Lab 07/22/14 0404 07/22/14 0802  GLUCAP 80 83   Fasting Lipid Panel:  Recent Labs Lab 07/22/14 0530  CHOL 180  HDL 34*  LDLCALC 108*  TRIG 191*  CHOLHDL 5.3   Urinalysis:  Recent Labs Lab 07/19/14 1757 07/21/14 2130  COLORURINE YELLOW YELLOW  LABSPEC 1.021 1.027  PHURINE 5.0 5.0  GLUCOSEU NEGATIVE NEGATIVE  HGBUR TRACE* NEGATIVE  BILIRUBINUR NEGATIVE SMALL*  KETONESUR NEGATIVE 15*  PROTEINUR >300* >300*  UROBILINOGEN 0.2 0.2  NITRITE NEGATIVE NEGATIVE  LEUKOCYTESUR TRACE* NEGATIVE   Studies/Results: Dg Chest 2 View  07/22/2014   CLINICAL DATA:  Stroke like symptoms.  EXAM: CHEST  2 VIEW  COMPARISON:  07/19/2014.  FINDINGS: There is  lung base opacity, greater on the right, most evident in the posterior right lower lobe on the lateral view. This may all be atelectasis. Pneumonia should be considered in the proper clinical setting.  Mild interstitial prominence is seen bilaterally. This is similar to the prior exam allowing for differences in technique. It may all be chronic. Mild interstitial edema is possible.  Cardiac silhouette borderline enlarged. Aorta is mildly uncoiled. No mediastinal or hilar masses. The bony thorax is diffusely demineralized but grossly intact.  IMPRESSION: 1. Similar appearance of the chest radiograph to the prior exam. 2. Are right lower lobe opacity  best seen posteriorly on the lateral view, is likely atelectasis. Pneumonia should be considered if there are consistent clinical symptoms. 3. Mild interstitial prominence is likely chronic. Consider mild interstitial edema if there shortness of breath.   Electronically Signed   By: Amie Portlandavid  Ormond M.D.   On: 07/22/2014 00:38   Ct Head Wo Contrast  07/21/2014   CLINICAL DATA:  Left sided weakness.  EXAM: CT HEAD WITHOUT CONTRAST  TECHNIQUE: Contiguous axial images were obtained from the base of the skull through the vertex without intravenous contrast.  COMPARISON:  07/16/2014  FINDINGS: Ventricles normal in overall configuration. There is ventricular and sulcal enlargement reflecting moderate atrophy. Several small lacune infarcts are noted in the basal ganglia and central white matter. Patchy white matter hypoattenuation is noted elsewhere consistent with chronic microvascular ischemic change.  No parenchymal masses or mass effect. There is no evidence of a recent cortical infarct.  There are no extra-axial masses or abnormal fluid collections.  There is no intracranial hemorrhage.  Visualized sinuses and mastoid air cells are clear. No skull lesion.  IMPRESSION: 1. No acute intracranial abnormalities. 2. No change from the recent prior CT scan the brain.   Electronically Signed   By: Amie Portlandavid  Ormond M.D.   On: 07/21/2014 22:33   Mr Brain Wo Contrast  07/22/2014   CLINICAL DATA:  Nursing home patient noted to have LEFT-sided weakness at noon today, fell 2 days ago. Chest pain.  EXAM: MRI HEAD WITHOUT CONTRAST  MRA HEAD WITHOUT CONTRAST  TECHNIQUE: Multiplanar, multiecho pulse sequences of the brain and surrounding structures were obtained without intravenous contrast. Angiographic images of the head were obtained using MRA technique without contrast.  COMPARISON:  CT of the head July 29, 2014 at 2223 hr  FINDINGS: MRI HEAD FINDINGS  16 mm ovoid focus of reduced diffusion within the RIGHT thalamus/ posterior  limb of the internal capsule, corresponding low ADC values consistent with acute process. No susceptibility artifact to suggest hemorrhage.  Patchy supratentorial, to lesser extent pontine T2 hyperintensities, with cystic changes within the pontine consistent with remote infarcts. Remote bilateral basal ganglia, bilateral thalamus lacunar infarcts. Remote bilateral small cerebellar infarcts. Moderate to severe ventriculomegaly, likely on the basis of global parenchymal brain volume loss as there is overall enlargement of the cerebral sulci and cerebellar folia. No midline shift or mass effect, No mass lesions. Mild mid brain atrophy though, the tectum appears normal.  No abnormal extra-axial fluid collections. Ocular globes and orbital contents are unremarkable though not tailored for evaluation. Trace paranasal sinus mucosal thickening without air-fluid levels. The mastoid air cells are well aerated. No abnormal sellar expansion. No cerebellar tonsillar ectopia.  MRA HEAD FINDINGS  Anterior circulation: Normal flow related enhancement of the included cervical, petrous, cavernous and supra clinoid internal carotid arteries. Patent anterior communicating artery. Supernumerary RIGHT anterior cerebral artery arising from RIGHT A1-2 junction, normal variant.  Normal flow related enhancement of the proximal anterior and middle cerebral arteries, including more distal segments. Mild luminal irregularity mid and distal branches. Focal mid to high-grade stenosis RIGHT M3/4 branch and distal RIGHT A2.  No large vessel occlusion, high-grade stenosis, aneurysm.  Posterior circulation: RIGHT vertebral artery is dominant. Focal luminal irregularity of the intradural RIGHT vertebral artery, axial of 22/154. Basilar artery is patent, with normal flow related enhancement of the main branch vessels. Normal flow related enhancement of the posterior cerebral arteries. Mild luminal irregularity of the mid and distal branches.  No large  vessel occlusion, high-grade stenosis, aneurysm.  IMPRESSION: MRI HEAD: Acute RIGHT thalamus/internal capsule lacunar infarct.  Remote bilateral basal ganglia, thalamus lacunar infarcts. Remote pontine and cerebellar small infarcts. Moderate to severe white matter changes consistent with chronic small vessel ischemic disease.  Mild thinning of the midbrain, in a background of moderate to severe global parenchymal brain volume loss.  MRA HEAD: No large vessel occlusion.  Luminal irregularity of the RIGHT vertebral artery may reflect chronic dissection without stenosis, possible infundibulum.  Luminal irregularity of the cerebral arteries most consistent with atherosclerosis with mid high-grade stenosis RIGHT M3/4 branch and distal RIGHT A2.   Electronically Signed   By: Awilda Metro   On: 07/22/2014 01:45   Mr Maxine Glenn Head/brain Wo Cm  07/22/2014   CLINICAL DATA:  Nursing home patient noted to have LEFT-sided weakness at noon today, fell 2 days ago. Chest pain.  EXAM: MRI HEAD WITHOUT CONTRAST  MRA HEAD WITHOUT CONTRAST  TECHNIQUE: Multiplanar, multiecho pulse sequences of the brain and surrounding structures were obtained without intravenous contrast. Angiographic images of the head were obtained using MRA technique without contrast.  COMPARISON:  CT of the head July 29, 2014 at 2223 hr  FINDINGS: MRI HEAD FINDINGS  16 mm ovoid focus of reduced diffusion within the RIGHT thalamus/ posterior limb of the internal capsule, corresponding low ADC values consistent with acute process. No susceptibility artifact to suggest hemorrhage.  Patchy supratentorial, to lesser extent pontine T2 hyperintensities, with cystic changes within the pontine consistent with remote infarcts. Remote bilateral basal ganglia, bilateral thalamus lacunar infarcts. Remote bilateral small cerebellar infarcts. Moderate to severe ventriculomegaly, likely on the basis of global parenchymal brain volume loss as there is overall enlargement of  the cerebral sulci and cerebellar folia. No midline shift or mass effect, No mass lesions. Mild mid brain atrophy though, the tectum appears normal.  No abnormal extra-axial fluid collections. Ocular globes and orbital contents are unremarkable though not tailored for evaluation. Trace paranasal sinus mucosal thickening without air-fluid levels. The mastoid air cells are well aerated. No abnormal sellar expansion. No cerebellar tonsillar ectopia.  MRA HEAD FINDINGS  Anterior circulation: Normal flow related enhancement of the included cervical, petrous, cavernous and supra clinoid internal carotid arteries. Patent anterior communicating artery. Supernumerary RIGHT anterior cerebral artery arising from RIGHT A1-2 junction, normal variant. Normal flow related enhancement of the proximal anterior and middle cerebral arteries, including more distal segments. Mild luminal irregularity mid and distal branches. Focal mid to high-grade stenosis RIGHT M3/4 branch and distal RIGHT A2.  No large vessel occlusion, high-grade stenosis, aneurysm.  Posterior circulation: RIGHT vertebral artery is dominant. Focal luminal irregularity of the intradural RIGHT vertebral artery, axial of 22/154. Basilar artery is patent, with normal flow related enhancement of the main branch vessels. Normal flow related enhancement of the posterior cerebral arteries. Mild luminal irregularity of the mid and distal branches.  No large vessel occlusion, high-grade stenosis, aneurysm.  IMPRESSION: MRI HEAD: Acute RIGHT thalamus/internal capsule lacunar infarct.  Remote bilateral basal ganglia, thalamus lacunar infarcts. Remote pontine and cerebellar small infarcts. Moderate to severe white matter changes consistent with chronic small vessel ischemic disease.  Mild thinning of the midbrain, in a background of moderate to severe global parenchymal brain volume loss.  MRA HEAD: No large vessel occlusion.  Luminal irregularity of the RIGHT vertebral artery  may reflect chronic dissection without stenosis, possible infundibulum.  Luminal irregularity of the cerebral arteries most consistent with atherosclerosis with mid high-grade stenosis RIGHT M3/4 branch and distal RIGHT A2.   Electronically Signed   By: Awilda Metro   On: 07/22/2014 01:45   Medications: I have reviewed the patient's current medications. Scheduled Meds: . aspirin  300 mg Rectal Daily   Or  . aspirin  325 mg Oral Daily  . heparin  5,000 Units Subcutaneous 3 times per day  . insulin aspart  0-15 Units Subcutaneous 6 times per day   Continuous Infusions:  PRN Meds:.senna-docusate Assessment/Plan: Active Problems:   CVA (cerebral vascular accident)   Stroke-like symptoms  Mr. Barry Buckley is a 74 yo man admitted from ALF for stroke workup.  Right Thalamus/Internal Capsule Lacunar Infarct: MRI s/f acute right thalamus/internal capsule lacunar infarct and no large vessel occlusion. Most likely embolic vs large vessel disease (given anterior choroidal involvement) with right-sided arm and leg weakness. Complicated by Mr. Barry Buckley's dementia. Carotid dopplers show 1-39% ICA stenosis bilaterally. Fasting lipid panel: cholesterol 180, triglycerides 191, HDL 34, LDL 108. - Contacting [facility] for more records and information - Appreciate Neurology following - Echo pending - 325 mg ASA (now oral) - Patient now off bed rest; OT and PT to evaluate - Cardiac monitoring; can consider removing it tomorrow if there are no events, as cardiac monitoring is only possibly beneficial (not indicated) for acute neurological events - Risk factor modification - High-intensity statin should be considered (patient is at 53% risk for cardiovascular event in the next 10 years) - A1c pending  Resolved Leukocytosis: Initial WBC 12.9-->9.0. Other SIRS criteria negative. Could be reactive from his falls. UA nitrite and leukesterase negative with few bacteria; no dysuria. Pulmonary exam clear to  auscultation but limited by patient's position. CXR s/f RLL opacity (likely atelectasis, but could also be PNA). Daughter tells of some recent signs of illness, but patient not coughing in hospital. - Continue to follow clinically; may need to treat for PNA depending on progression - Continue to trend WBC - NS at 75 cc/hr; may increase depending on echo results  Acute vs Chronic Renal Disease: Cr 2.90-->2.56 with GFR 20. No baseline. Dry on exam.  - NS at 75 cc/hr; may increase depending on echo results - Continue to trend  DM2: No hemoglobin A1c on record. New A1c pending. CBGs elevated on admission. On metformin 1000 mg BID, januvia 100 mg daily and lantus 16 U qha. - Holding home medications - SSI-moderate  Hypertension: Currently 145/81. On carvedilol 12.5 mg bid, losartan-HCTZ 100-12.5 mg daily at home.  - Holding home meds for permissive hypertension  Anemia: Hemoglobin 10.1 on presentation-->9.7. Takes ferrous sulfate 325 mg BID at home for presumed iron deficiency anemia. - Continue to trend  Advanced Dementia: Now oriented x2, but states that it is 1986. Currently on donepezil 10 mg daily. - Hold donepezil at present  Diet: Speech evaluation recommends dysphagia 3 - SLP recommends SNF  DVT Ppx: heparin [place]   Dispo: Disposition is deferred at this time,  awaiting improvement of current medical problems.  Anticipated discharge in approximately 2-3 day(s).   The patient does have a current PCP (Ron Parker, MD) and does not need an [facility] hospital follow-up appointment after discharge.  The patient does have transportation limitations that hinder transportation to clinic appointments.  .Services Needed at time of discharge: Y = Yes, Blank = No PT:   OT:   RN:   Equipment:   Other:     LOS: 1 day   Dionne Ano, MD 07/22/2014, 12:16 PM

## 2014-07-22 NOTE — Evaluation (Signed)
Occupational Therapy Evaluation Patient Details Name: Shelby MattocksJimmy Leis MRN: [id number] DOB: 1940-12-02 Today's Date: 07/22/2014    History of Present Illness This 74 y.o. admitted from [facility] with Lt sided weakness.  MRI revealed Rt. Thalamus/internal lacuner infarct.  Remote bil. basal ganglia, thalamus lacunar infarcts; remote pontine and cerebellar small infarcts.  PMH includes;  dementia, DM; acute on chronic renal disease; HTN   Clinical Impression   Pt admitted with above. He presents to OT with dsymmetria Lt. UE, impaired vision, impaired balance, and impaired cognition (h/o dementia).  He currently requires mod - max A for BADLs.  He resided at ALF PTA, and unable to determine his baseline functioning (PT attempted to call facility but unable to make contact with caregiver familiar with pt).   Per chart review, pt with multiple falls at ALF, anticipate he will need higher level of care at discharge.  Recommend SNF.     Follow Up Recommendations  SNF    Equipment Recommendations  None recommended by OT    Recommendations for Other Services       Precautions / Restrictions Precautions Precautions: Fall Restrictions Weight Bearing Restrictions: No      Mobility Bed Mobility                  Transfers Overall transfer level: Needs assistance Equipment used: 1 person hand held assist Transfers: Sit to/from Stand Sit to Stand: Mod assist              Balance Overall balance assessment: Needs assistance Sitting-balance support: Feet supported Sitting balance-Leahy Scale: Poor Sitting balance - Comments: Pt loses balance to Lt  Postural control: Left lateral lean Standing balance support: Bilateral upper extremity supported Standing balance-Leahy Scale: Poor                              ADL Overall ADL's : Needs assistance/impaired Eating/Feeding: Set up;Sitting   Grooming: Wash/dry hands;Wash/dry face;Set up;Supervision/safety;Sitting    Upper Body Bathing: Moderate assistance;Sitting   Lower Body Bathing: Maximal assistance;Sit to/from stand   Upper Body Dressing : Moderate assistance;Sitting   Lower Body Dressing: Total assistance;Sit to/from stand   Toilet Transfer: Moderate assistance;Stand-pivot;BSC   Toileting- Clothing Manipulation and Hygiene: Total assistance;Sit to/from stand       Functional mobility during ADLs: Moderate assistance General ADL Comments: Pt requires mod A to don/doff socks.  Pt became very irritable when OT asked him to perform simple ADL tasks.  States he's "give out"      Vision                 Additional Comments: Pt unable to provide info.  He will track object into right fields, but consistently loses object as it crosses midline to the Lt.  He will, however, look to Lt when asked.  He does appear to have a mildy dysconjugate gaze as he looks to Lt - denies diplopia    Perception     Praxis      Pertinent Vitals/Pain Pain Assessment: No/denies pain     Hand Dominance Right   Extremity/Trunk Assessment Upper Extremity Assessment Upper Extremity Assessment: Generalized weakness LUE Deficits / Details: Pt with full AROM, dysmetria noted  LUE Coordination: decreased fine motor;decreased gross motor   Lower Extremity Assessment Lower Extremity Assessment: Defer to PT evaluation       Communication Communication Communication: No difficulties   Cognition     Overall Cognitive Status:  No family/caregiver present to determine baseline cognitive functioning                     General Comments       Exercises       Shoulder Instructions      Home Living Family/patient expects to be discharged to:: Skilled nursing facility     Type of Home: Assisted living                                  Prior Functioning/Environment Level of Independence: Needs assistance  Gait / Transfers Assistance Needed: Pt unable to provide accurate info.   Per chart review, staff assisted pt with transfers into w/c, but unsure if he was ambulatory also.   Per chart, pt with h/o falls  ADL's / Homemaking Assistance Needed: Unable to determine PLOF with ADLs.  PT attemtped to contact ALF, but when she was transferred to someone familiar with pt, no one answered         OT Diagnosis: Generalized weakness;Cognitive deficits;Ataxia   OT Problem List: Decreased strength;Decreased activity tolerance;Impaired balance (sitting and/or standing);Impaired vision/perception;Decreased coordination;Decreased cognition;Decreased safety awareness;Decreased knowledge of use of DME or AE;Impaired UE functional use   OT Treatment/Interventions:      OT Goals(Current goals can be found in the care plan section) Acute Rehab OT Goals OT Goal Formulation: All assessment and education complete, DC therapy  OT Frequency:     Barriers to D/C: Decreased caregiver support          Co-evaluation              End of Session Nurse Communication: Mobility status  Activity Tolerance: Patient tolerated treatment well Patient left: in chair;with call bell/phone within reach;with chair alarm set   Time: 1344-1359 OT Time Calculation (min): 15 min Charges:  OT General Charges $OT Visit: 1 Procedure OT Evaluation $Initial OT Evaluation Tier I: 1 Procedure G-Codes:    Ivori Storr, Ursula Alert M 2014/08/08, 2:27 PM

## 2014-07-22 NOTE — Progress Notes (Signed)
Subjective: Patient states he feels well enough to go home though he does not like his current assisted living facility. After contacting the nursing home for more information, the nurse taking care of him reported that he was slumped over in a chair unable to walk and ROS was only positive for chest wall pain for which he is receiving tramadol since his fall on 1/15 when he was outside smoking. The nurse from [facility] [phone]) also reported that it is a memory care center with PT services three times per week.  In addition, after talking with daughter Barry Buckley [phone]) patient had a stroke 5 years ago with difficulty sitting upright, walking, swallowing, and having slurred speech. All of these symptoms resolved after patient worked with physical therapy. Patient has a history of alcohol abuse having one unresponsive episode outside in the cold as well. His hemoglobin has been chronically low for 10 years with unknown etiology. Per the daughter, the patient has been confused since 1/18 having difficulty remembering her name or stating how many daughters he has. His movements have been slower with difficulty picking up his feet not particularly marked on either side and his last well known from her point of view is 1/14 before his fall on 1/15. Of note, daughter Barry Buckley is not happy with the care her father has been receiving since he moved to [facility] and would prefer for him to go to [facility] in [place] where patient's wife lives.  Objective: Vital signs in last 24 hours: Filed Vitals:   07/22/14 0140 07/22/14 0412 07/22/14 0553 07/22/14 0800  BP: 141/63 122/58 132/61 147/74  Pulse: 59 66 57 60  Temp: 97.7 F (36.5 C) 97.9 F (36.6 C) 97.5 F (36.4 C) 97.9 F (36.6 C)  TempSrc: Oral Axillary Oral Oral  Resp: Height:      Weight:      SpO2: 93% 95% 100% 96%   Weight change:  No intake or output data in the 24 hours ending 07/22/14  0950 Constitutional: Patient lying in bed in no acute distress and cooperative with exam Nose: No erythema or drainage noted Mouth: No erythema or exudates, MMM Eyes: PERRL, EOMI, conjunctivae normal Cardiovascular: RRR, S1 normal, S2 normal, no murmurs, rubs, or gallops Pulmonary/Chest: normal respiratory effort, CTAB on anterior lung fields though limited because patient unable to roll over to one side or sit up Abdominal: Soft, non-tender, non-distended, bowel sounds present Neurological: Alert, oriented to person, place, but not time. Speech with mild dysarthria. Full hand grip, biceps, and triceps bilaterally. Hip extensors 4/5 on R and 3/5 on L. 1/5 plantarflexion bilaterally. 4/5 dorsiflexion bilaterally. Upgoing toes on left. Cranial nerve II-XII are grossly intact. Sensory intact to light touch bilaterally at upper and lower extremities. Finger to nose intact on right and dysmetria on left Skin: Warm, dry, and intact Psychiatric: Normal mood and affect  Lab Results: Basic Metabolic Panel:  Recent Labs Lab 07/21/14 2020 07/22/14 0749  NA 139 142  K 4.0 3.9  CL 105 109  CO2 21 24  GLUCOSE 173* 99  BUN 57* 57*  CREATININE 2.90* 2.56*  CALCIUM 9.5 9.4  MG  --  2.0   Liver Function Tests: No results for input(s): AST, ALT, ALKPHOS, BILITOT, PROT, ALBUMIN in the last 168 hours. No results for input(s): LIPASE, AMYLASE in the last 168 hours. No results for input(s): AMMONIA in the last 168 hours. CBC:  Recent Labs Lab 07/21/14 2020 07/22/14  0749  WBC 12.9* 9.0  NEUTROABS 10.5*  --   HGB 10.1* 9.7*  HCT 30.5* 29.2*  MCV 87.9 88.8  PLT 256 233   Cardiac Enzymes: No results for input(s): CKTOTAL, CKMB, CKMBINDEX, TROPONINI in the last 168 hours. BNP: No results for input(s): PROBNP in the last 168 hours. D-Dimer: No results for input(s): DDIMER in the last 168 hours. CBG:  Recent Labs Lab 07/22/14 0404 07/22/14 0802  GLUCAP 80 83   Hemoglobin A1C: No  results for input(s): HGBA1C in the last 168 hours. Fasting Lipid Panel:  Recent Labs Lab 07/22/14 0530  CHOL 180  HDL 34*  LDLCALC 108*  TRIG 191*  CHOLHDL 5.3   Thyroid Function Tests: No results for input(s): TSH, T4TOTAL, FREET4, T3FREE, THYROIDAB in the last 168 hours. Coagulation: No results for input(s): LABPROT, INR in the last 168 hours. Anemia Panel: No results for input(s): VITAMINB12, FOLATE, FERRITIN, TIBC, IRON, RETICCTPCT in the last 168 hours. Urine Drug Screen: Drugs of Abuse  No results found for: LABOPIA, COCAINSCRNUR, LABBENZ, AMPHETMU, THCU, LABBARB  Alcohol Level: No results for input(s): ETH in the last 168 hours. Urinalysis:  Recent Labs Lab 07/19/14 1757 07/21/14 2130  COLORURINE YELLOW YELLOW  LABSPEC 1.021 1.027  PHURINE 5.0 5.0  GLUCOSEU NEGATIVE NEGATIVE  HGBUR TRACE* NEGATIVE  BILIRUBINUR NEGATIVE SMALL*  KETONESUR NEGATIVE 15*  PROTEINUR >300* >300*  UROBILINOGEN 0.2 0.2  NITRITE NEGATIVE NEGATIVE  LEUKOCYTESUR TRACE* NEGATIVE    Micro Results: No results found for this or any previous visit (from the past 240 hour(s)). Studies/Results: Dg Chest 2 View  07/22/2014   CLINICAL DATA:  Stroke like symptoms.  EXAM: CHEST  2 VIEW  COMPARISON:  07/19/2014.  FINDINGS: There is lung base opacity, greater on the right, most evident in the posterior right lower lobe on the lateral view. This may all be atelectasis. Pneumonia should be considered in the proper clinical setting.  Mild interstitial prominence is seen bilaterally. This is similar to the prior exam allowing for differences in technique. It may all be chronic. Mild interstitial edema is possible.  Cardiac silhouette borderline enlarged. Aorta is mildly uncoiled. No mediastinal or hilar masses. The bony thorax is diffusely demineralized but grossly intact.  IMPRESSION: 1. Similar appearance of the chest radiograph to the prior exam. 2. Are right lower lobe opacity best seen posteriorly on  the lateral view, is likely atelectasis. Pneumonia should be considered if there are consistent clinical symptoms. 3. Mild interstitial prominence is likely chronic. Consider mild interstitial edema if there shortness of breath.   Electronically Signed   By: Amie Portland M.D.   On: 07/22/2014 00:38   Ct Head Wo Contrast  07/21/2014   CLINICAL DATA:  Left sided weakness.  EXAM: CT HEAD WITHOUT CONTRAST  TECHNIQUE: Contiguous axial images were obtained from the base of the skull through the vertex without intravenous contrast.  COMPARISON:  07/16/2014  FINDINGS: Ventricles normal in overall configuration. There is ventricular and sulcal enlargement reflecting moderate atrophy. Several small lacune infarcts are noted in the basal ganglia and central white matter. Patchy white matter hypoattenuation is noted elsewhere consistent with chronic microvascular ischemic change.  No parenchymal masses or mass effect. There is no evidence of a recent cortical infarct.  There are no extra-axial masses or abnormal fluid collections.  There is no intracranial hemorrhage.  Visualized sinuses and mastoid air cells are clear. No skull lesion.  IMPRESSION: 1. No acute intracranial abnormalities. 2. No change from the recent prior  CT scan the brain.   Electronically Signed   By: Amie Portland M.D.   On: 07/21/2014 22:33   Mr Brain Wo Contrast  07/22/2014   CLINICAL DATA:  Nursing home patient noted to have LEFT-sided weakness at noon today, fell 2 days ago. Chest pain.  EXAM: MRI HEAD WITHOUT CONTRAST  MRA HEAD WITHOUT CONTRAST  TECHNIQUE: Multiplanar, multiecho pulse sequences of the brain and surrounding structures were obtained without intravenous contrast. Angiographic images of the head were obtained using MRA technique without contrast.  COMPARISON:  CT of the head July 29, 2014 at 2223 hr  FINDINGS: MRI HEAD FINDINGS  16 mm ovoid focus of reduced diffusion within the RIGHT thalamus/ posterior limb of the internal  capsule, corresponding low ADC values consistent with acute process. No susceptibility artifact to suggest hemorrhage.  Patchy supratentorial, to lesser extent pontine T2 hyperintensities, with cystic changes within the pontine consistent with remote infarcts. Remote bilateral basal ganglia, bilateral thalamus lacunar infarcts. Remote bilateral small cerebellar infarcts. Moderate to severe ventriculomegaly, likely on the basis of global parenchymal brain volume loss as there is overall enlargement of the cerebral sulci and cerebellar folia. No midline shift or mass effect, No mass lesions. Mild mid brain atrophy though, the tectum appears normal.  No abnormal extra-axial fluid collections. Ocular globes and orbital contents are unremarkable though not tailored for evaluation. Trace paranasal sinus mucosal thickening without air-fluid levels. The mastoid air cells are well aerated. No abnormal sellar expansion. No cerebellar tonsillar ectopia.  MRA HEAD FINDINGS  Anterior circulation: Normal flow related enhancement of the included cervical, petrous, cavernous and supra clinoid internal carotid arteries. Patent anterior communicating artery. Supernumerary RIGHT anterior cerebral artery arising from RIGHT A1-2 junction, normal variant. Normal flow related enhancement of the proximal anterior and middle cerebral arteries, including more distal segments. Mild luminal irregularity mid and distal branches. Focal mid to high-grade stenosis RIGHT M3/4 branch and distal RIGHT A2.  No large vessel occlusion, high-grade stenosis, aneurysm.  Posterior circulation: RIGHT vertebral artery is dominant. Focal luminal irregularity of the intradural RIGHT vertebral artery, axial of 22/154. Basilar artery is patent, with normal flow related enhancement of the main branch vessels. Normal flow related enhancement of the posterior cerebral arteries. Mild luminal irregularity of the mid and distal branches.  No large vessel occlusion,  high-grade stenosis, aneurysm.  IMPRESSION: MRI HEAD: Acute RIGHT thalamus/internal capsule lacunar infarct.  Remote bilateral basal ganglia, thalamus lacunar infarcts. Remote pontine and cerebellar small infarcts. Moderate to severe white matter changes consistent with chronic small vessel ischemic disease.  Mild thinning of the midbrain, in a background of moderate to severe global parenchymal brain volume loss.  MRA HEAD: No large vessel occlusion.  Luminal irregularity of the RIGHT vertebral artery may reflect chronic dissection without stenosis, possible infundibulum.  Luminal irregularity of the cerebral arteries most consistent with atherosclerosis with mid high-grade stenosis RIGHT M3/4 branch and distal RIGHT A2.   Electronically Signed   By: Awilda Metro   On: 07/22/2014 01:45   Mr Maxine Glenn Head/brain Wo Cm  07/22/2014   CLINICAL DATA:  Nursing home patient noted to have LEFT-sided weakness at noon today, fell 2 days ago. Chest pain.  EXAM: MRI HEAD WITHOUT CONTRAST  MRA HEAD WITHOUT CONTRAST  TECHNIQUE: Multiplanar, multiecho pulse sequences of the brain and surrounding structures were obtained without intravenous contrast. Angiographic images of the head were obtained using MRA technique without contrast.  COMPARISON:  CT of the head July 29, 2014 at 2223 hr  FINDINGS: MRI HEAD FINDINGS  16 mm ovoid focus of reduced diffusion within the RIGHT thalamus/ posterior limb of the internal capsule, corresponding low ADC values consistent with acute process. No susceptibility artifact to suggest hemorrhage.  Patchy supratentorial, to lesser extent pontine T2 hyperintensities, with cystic changes within the pontine consistent with remote infarcts. Remote bilateral basal ganglia, bilateral thalamus lacunar infarcts. Remote bilateral small cerebellar infarcts. Moderate to severe ventriculomegaly, likely on the basis of global parenchymal brain volume loss as there is overall enlargement of the cerebral sulci  and cerebellar folia. No midline shift or mass effect, No mass lesions. Mild mid brain atrophy though, the tectum appears normal.  No abnormal extra-axial fluid collections. Ocular globes and orbital contents are unremarkable though not tailored for evaluation. Trace paranasal sinus mucosal thickening without air-fluid levels. The mastoid air cells are well aerated. No abnormal sellar expansion. No cerebellar tonsillar ectopia.  MRA HEAD FINDINGS  Anterior circulation: Normal flow related enhancement of the included cervical, petrous, cavernous and supra clinoid internal carotid arteries. Patent anterior communicating artery. Supernumerary RIGHT anterior cerebral artery arising from RIGHT A1-2 junction, normal variant. Normal flow related enhancement of the proximal anterior and middle cerebral arteries, including more distal segments. Mild luminal irregularity mid and distal branches. Focal mid to high-grade stenosis RIGHT M3/4 branch and distal RIGHT A2.  No large vessel occlusion, high-grade stenosis, aneurysm.  Posterior circulation: RIGHT vertebral artery is dominant. Focal luminal irregularity of the intradural RIGHT vertebral artery, axial of 22/154. Basilar artery is patent, with normal flow related enhancement of the main branch vessels. Normal flow related enhancement of the posterior cerebral arteries. Mild luminal irregularity of the mid and distal branches.  No large vessel occlusion, high-grade stenosis, aneurysm.  IMPRESSION: MRI HEAD: Acute RIGHT thalamus/internal capsule lacunar infarct.  Remote bilateral basal ganglia, thalamus lacunar infarcts. Remote pontine and cerebellar small infarcts. Moderate to severe white matter changes consistent with chronic small vessel ischemic disease.  Mild thinning of the midbrain, in a background of moderate to severe global parenchymal brain volume loss.  MRA HEAD: No large vessel occlusion.  Luminal irregularity of the RIGHT vertebral artery may reflect chronic  dissection without stenosis, possible infundibulum.  Luminal irregularity of the cerebral arteries most consistent with atherosclerosis with mid high-grade stenosis RIGHT M3/4 branch and distal RIGHT A2.   Electronically Signed   By: Awilda Metro   On: 07/22/2014 01:45   Medications:  Scheduled Meds: . aspirin  300 mg Rectal Daily   Or  . aspirin  325 mg Oral Daily  . heparin  5,000 Units Subcutaneous 3 times per day  . insulin aspart  0-15 Units Subcutaneous 6 times per day   Continuous Infusions:  PRN Meds:.senna-docusate  Assessment/Plan: Active Problems:   CVA (cerebral vascular accident)   Stroke-like symptoms   Barry Buckley is a 74 y.o. male with PMH of dementia, DM2, HTN who presented with left sided weakness with brain MRI showing PLIC stroke with unclear large vessel, small vessel, vs. Embolic etiology.  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 He does have weakness of the LLE on exam and asymmetric strength in the LUE. No tpa was given by ED due to unknown time frame. While patient said he might have had stroke in the past when questioned, he is unreliable. We will attempt to contact [facility] nursing home for further history. In the meantime, he is to be seen by neurology and will receive  stroke work-up. He already received ASA 300 mg pr in the ED. -Neuro c/s, recs appreciated: Right anterior choroidal artery infarct. Suggested 30 day outpatient cardiac monitoring to rule out atrial fibrillation and lipitor 80 mg because LDL 108 and patient has had multiple strokes -2d echo w contrast was performed. Final results pending -On 1/21 carotid doppler: 1-39% internal carotid artery stenosis bilaterally. Vertebral arteries are patent with antegrade flow -Hemoglobin A1c: 6% -Lipid panel: Cholesterol 180, Triglycerides 191, HDL 34 -Hold antihypertensives for permissive hypertension -ASA 325 mg po  daily -PT/OT/SLP: Recommend SNF and patient is now on Dysphagia 3 diet -CT Head: No acute intracranial abnormalities or change from the recent prior CT scan the brain -MRI Brain: Acute RIGHT thalamus/internal capsule lacunar infarct. Remote bilateral basal ganglia, thalamus lacunar infarcts. Remote pontine and cerebellar small infarcts. Moderate to severe white matter changes consistent with chronic small vessel ischemic Disease. Mild thinning of the midbrain, in a background of moderate to severe global parenchymal brain volume loss -MRA Brain: No large vessel occlusion. Luminal irregularity of the RIGHT vertebral artery may reflect chronic dissection without stenosis, possible infundibulum. Luminal irregularity of the cerebral arteries most consistent with atherosclerosis with mid high-grade stenosis RIGHT M3/4 branch and distal RIGHT A2 -Smoking counseling cessation  Leukocytosis (resolved): WBC has decreased to 9. Presenting WBC count of 12.9 with no previous in our EMR. Unclear cause as his other SIRS criteria are negative. It amy be reactive from his falls. His ROS was negative but he is unreliable historian. UA nitrite and LE negative with few bacteria but denies dysuria. Lungs sounded clear anteriorly but he was uncooperative with exam. Patient stated on 1/21 that dry cough and feeling that he needed to bring something up has improved -CXR: 1. Similar appearance of the chest radiograph to the prior exam. 2. right lower lobe opacity best seen posteriorly on the lateral view, is likely atelectasis. Pneumonia should be considered if there are consistent clinical symptoms. 3. Mild interstitial prominence is likely chronic. Consider mild interstitial edema if there shortness of breath. -BCx x 2 not ordered because patient hasn't had a fever or become hemodynamically unstable  Acute versus chronic renal disease: Presenting creatinine of 2.90, BUN 57, and GFR 20. We do not have any baseline  renal function on him. He did appear dry on exam. While he denied any issues with appetite or swallowing, he may be dehydrated due to malnutrition given ketones in urine. -On 1/21: Cr 2.56 GFR 23 -Patient on dysphagia 3 diet so we need to continue to hydrate -Unknown heart function so s/p NS @ 75 cc/hr x 8 h on 1/20. He is tolerating liquids by mouth. We will re-evaluate after echo  DM2: On 1/21 hemoglobin A1c 6%. Presenting glucose of 173. At nursing home he is on metformin 1000 mg bid, januvia 100 mg daily, and lantus 16 u qhs -hold metformin 1000 mg bid, januvia 100 mg daily, and lantus 16 u qhs (no basal for now) -SSI-moderate. Blood glucose has been at around 100  HTN: Presenting BP of 130/61 with BP in the 120s-140s/60s. At nursing home he is on carvedilol 12.5 mg bid, losartan-HCTZ 100-12.5 mg daily -hold carvedilol 12.5 mg bid, losartan-HCTZ 100-12.5 mg daily for permissive hypertension  Anemia: Hemoglobin 10.1 on presentation. Per daughter Barry BladeLisa Buckley [phone]) patient has low Hgb at baseline and has been anemic for several years. He takes ferrous sulfate 325 mg bid at nursing home for presumed iron deficiency -cont to monitor and consider further work-up  if drop in hemoglobin.  Advanced Dementia: Mr Gascoigne was oriented to person and place but not year -hold home donepezil 10 mg daily for now  Diet: Dysphagia 3  DVT PPx: heparin 5000 u [place] three times daily  Code: full  Dispo: Disposition is deferred at this time, awaiting improvement of current medical problems. Anticipated discharge in approximately 1 day(s).   The patient does have a current PCP (Ron Parker, MD) and does need an [facility] hospital follow-up appointment after discharge.  The patient does have transportation limitations that hinder transportation to clinic appointments.  Services Needed at time of discharge: Y = Yes, Blank = No PT: SNF; Supervision/Assistance 24 hr  OT: SNF  RN:   Equipment:   Other:       LOS: 1 day   This is a Psychologist, occupational Note. The care of the patient was discussed with Dr. Burtis Junes and the assessment and plan was formulated with their assistance. Please see their note for official documentation of the patient encounter.  Signed: Chiquita Loth, Med Student Internal Medicine Teaching Service Team B2  [phone] 07/22/2014, 9:50 AM

## 2014-07-22 NOTE — Progress Notes (Signed)
PT Cancellation Note  Patient Details Name: Barry MattocksJimmy Bollen MRN: [id number] DOB: 09-12-1940   Cancelled Treatment:    Reason Eval/Treat Not Completed: Patient not medically ready Pt on bedrest. Will await increase in activity orders prior to initiation of PT evaluation.    Alvie HeidelbergFolan, [name] A 07/22/2014, 8:50 AM Alvie HeidelbergShauna Folan, PT, DPT [phone]

## 2014-07-22 NOTE — Progress Notes (Signed)
Patient arrived to [address] and pleasant. Patient requesting water and complaining of pain in his right side and back. Tele was placed and vital signs taken. Questions answered and oriented to the room. Patient taken to MRI and X-Ray a few minutes after arrival. Will continue to monitor. Zaria Taha, [place] ScrapeSarah E RN

## 2014-07-23 LAB — GLUCOSE, CAPILLARY
GLUCOSE-CAPILLARY: 219 mg/dL — AB (ref 70–99)
GLUCOSE-CAPILLARY: 249 mg/dL — AB (ref 70–99)
Glucose-Capillary: 183 mg/dL — ABNORMAL HIGH (ref 70–99)
Glucose-Capillary: 218 mg/dL — ABNORMAL HIGH (ref 70–99)

## 2014-07-23 MED ORDER — ATORVASTATIN CALCIUM 40 MG PO TABS
40.0000 mg | ORAL_TABLET | Freq: Every day | ORAL | Status: DC
  Administered 2014-07-23 – 2014-07-26 (×4): 40 mg via ORAL
  Filled 2014-07-23 (×4): qty 1

## 2014-07-23 NOTE — Progress Notes (Signed)
Physical Therapy Treatment Patient Details Name: Barry Buckley MRN: [id number] DOB: September 19, 1940 Today's Date: 07/23/2014    History of Present Illness This 74 y.o. admitted from [facility] with Lt sided weakness.  MRI revealed Rt. Thalamus/internal lacuner infarct.  Remote bil. basal ganglia, thalamus lacunar infarcts; remote pontine and cerebellar small infarcts.  PMH includes;  dementia, DM; acute on chronic renal disease; HTN    PT Comments    Patient progressing slowly with mobility. Requires assist with bed mobility and transfers secondary to poor trunk control and poor body awareness. Pt with left lateral lean in sitting and standing position and difficulty self correcting even with manual/verbal cues. Continues to be appropriate for SNF. Will follow acutely.   Follow Up Recommendations  SNF;Supervision/Assistance - 24 hour     Equipment Recommendations  None recommended by PT    Recommendations for Other Services       Precautions / Restrictions Precautions Precautions: Fall Restrictions Weight Bearing Restrictions: No    Mobility  Bed Mobility Overal bed mobility: Needs Assistance Bed Mobility: Supine to Sit     Supine to sit: Mod assist;HOB elevated     General bed mobility comments: Use of rails. Step by step cues for technique. increased time. Pt reaching out for therapist's hand to assist with trunk.  Transfers Overall transfer level: Needs assistance Equipment used: Rolling walker (2 wheeled) Transfers: Sit to/from [employer] Sit to Stand: Mod assist Stand pivot transfers: Mod assist       General transfer comment: Mod A to stand from chair with cues for hand placement/technique. Left lateral trunk lean in standing. Stood Clinical research associate. Able to self correct minimally with max manual and verbal cues. SPT bed<->chair Mod A.  Ambulation/Gait Ambulation/Gait assistance: Min assist           General Gait Details: Pt with severe left lateral  trunk lean; able to minimally correct with manual/verbal cues. Performed pre gait marching in standing with assist for weight shifting. Not able to clear LLE even with cues.   Stairs            Wheelchair Mobility    Modified Rankin (Stroke Patients Only) Modified Rankin (Stroke Patients Only) Pre-Morbid Rankin Score: Moderately severe disability Modified Rankin: Moderately severe disability     Balance Overall balance assessment: Needs assistance Sitting-balance support: Feet supported;No upper extremity supported Sitting balance-Leahy Scale: Poor Sitting balance - Comments: LOB towards left side; strong left lateral lean.   Standing balance support: During functional activity;Bilateral upper extremity supported Standing balance-Leahy Scale: Poor Standing balance comment: Left lateral lean in standing.                    Cognition Arousal/Alertness: Awake/alert Behavior During Therapy: WFL for tasks assessed/performed Overall Cognitive Status: No family/caregiver present to determine baseline cognitive functioning                      Exercises      General Comments        Pertinent Vitals/Pain Pain Assessment: Faces Faces Pain Scale: Hurts a little bit Pain Location: back Pain Descriptors / Indicators: Sore Pain Intervention(s): Monitored during session;Repositioned    Home Living                      Prior Function            PT Goals (current goals can now be found in the care plan section) Progress towards PT  goals: Progressing toward goals    Frequency  Min 3X/week    PT Plan Current plan remains appropriate    Co-evaluation             End of Session Equipment Utilized During Treatment: Gait belt Activity Tolerance: Patient limited by pain Patient left: in chair;with call bell/phone within reach;with nursing/sitter in room     Time: [id number] PT Time Calculation (min) (ACUTE ONLY): 14 min  Charges:   $Therapeutic Activity: 8-22 mins                    G CodesAlvie Heidelberg:      Folan, Aileana Hodder A 07/23/2014, 11:04 AM Alvie HeidelbergShauna Folan, PT, DPT [phone]

## 2014-07-23 NOTE — Clinical Social Work Psychosocial (Signed)
Clinical Social Work Department BRIEF PSYCHOSOCIAL ASSESSMENT 07/23/2014  Patient:  Barry Buckley,Barry Buckley     Account Number:  [id number]     Admit date:  07/21/2014  Clinical Social Worker:  Derenda FennelNIXON,Leodan Bolyard, CLINICAL SOCIAL WORKER  Date/Time:  07/23/2014 11:08 AM  Referred by:  Physician  Date Referred:  07/23/2014 Referred for  SNF Placement   Other Referral:   Interview type:  Other - See comment Other interview type:   CSW spoke with pt's daughter, Barry Buckley via telephone.    PSYCHOSOCIAL DATA Living Status:  FACILITY [facility]  Level of care:  Assisted Living Primary support name:  Barry Buckley Primary support relationship to patient:  CHILD, ADULT Degree of support available:   Strong    CURRENT CONCERNS Current Concerns  Post-Acute Placement   Other Concerns:    SOCIAL WORK ASSESSMENT / PLAN Clinical Social Worker spoke with pt's dtr at length in reference to post-acute placement for SNF. CSW explained CSW role and SNF process. [name] reported pt [facility]. [name] further reported pt has been at [facility] in the past and would like for pt to return for short-term rehab. [name] stated [facility] and she would like for them to be together. CSW submitted for [facility] which is under manual review. CSW contacted [facility] who reported the facility provides PT/OT twice per week. CSW will continue to follow pt and pt's family and facilitate pt's discharge needs once medically stable.   Assessment/plan status:  Psychosocial Support/Ongoing Assessment of Needs Other assessment/ plan:   FL-2 on chart for MD signature.   Information/referral to community resources:   SNF information/ list provided.    PATIENT'S/FAMILY'S RESPONSE TO PLAN OF CARE: Pt sitting at bedside watching tv. [name] agreeable to SNF placement and requested [facility]. [name]  pleasant and appreciated social work intervention.    Derenda FennelBashira Abimbola Buckley, MSW, LCSWA [phone] 07/23/2014 11:46 AM

## 2014-07-23 NOTE — Care Management Note (Addendum)
  Page 1 of 1   07/26/2014     2:11:24 PM CARE MANAGEMENT NOTE 07/26/2014  Patient:  Shelby MattocksCOX,Nyjah   Account Number:  [id number]  Date Initiated:  07/23/2014  Documentation initiated by:  Elmer BalesOBARGE,Alexander Aument  Subjective/Objective Assessment:   Patient was admitted with CVA. [facility]     Action/Plan:   Will follow for discharge needs pending PT/OT evals and physician orders.   Anticipated DC Date:     Anticipated DC Plan:  SKILLED NURSING FACILITY  In-house referral  Clinical Social Worker         Choice offered to / List presented to:             Status of service:   Medicare Important Message given?  YES (If response is "NO", the following Medicare IM given date fields will be blank) Date Medicare IM given:  07/23/2014 Medicare IM given by:  Elmer BalesOBARGE,Isadora Delorey Date Additional Medicare IM given:  07/26/2014 Additional Medicare IM given by:  Elmer BalesOURTNEY Yahmir Sokolov  Discharge Disposition:    Per UR Regulation:  Reviewed for med. necessity/level of care/duration of stay  If discussed at Long Length of Stay Meetings, dates discussed:    Comments:   07/26/14 1130 Elmer Balesourtney Gabrielle Wakeland RN, MSN, CM- Additional Medicare IM letter provided.   07/23/14 0945 Elmer Balesourtney Chanceler Pullin RN, MSN, CM- Medicare IM letter provided.

## 2014-07-23 NOTE — Progress Notes (Signed)
Pt seen and examined with residents on morning rounds.  Pt feels well and denies any weakness. No new complaints   Physical Exam: Gen: AAO*2, NAD CVS: RRR, normal heart sounds Lungs: CTA b/l Abd: soft, non tender, BS + Ext: no edema Neuro: Power is 4/5 L UE and L LE, 5/5 R UE and RLE  Assessment and Plan: 74 y/o male with new left sided weakness secondary to CVA  Acute CVA: - pt with acute R thalamic/internal capsule infarct - Neuro signed off. Will need outpatient f/u - Will need 30 day Holter monitor placed to look for afib - c/w asa, lipitor - Hold antihypertensives to allow for permissive HTN. May resume carvedilol in AM - carotid dopplers and 2 D ECHO results noted - monitor on tele - c/w dysphagia 3 diet  AKI on CKD: - pt with GFR of approx 40 in December and now with GFR of 20 - Likely component of dehydration. C/w IVF for now - Hold HCTZ, ARB - Recheck BMP today  HTN: - hold antihypertensives to allow for permissive HTN  DM: - BS at goal. Will monitor on SSI  Pt is stable for d/c to SNF once bed available

## 2014-07-23 NOTE — Clinical Social Work Placement (Addendum)
Clinical Social Work Department CLINICAL SOCIAL WORK PLACEMENT NOTE 07/23/2014  Patient:  Barry MattocksCOX,Joram  Account Number:  [id number] Admit date:  07/21/2014  Clinical Social Worker:  Derenda FennelBASHIRA Taivon Haroon, CLINICAL SOCIAL WORKER  Date/time:  07/23/2014 02:00 PM  Clinical Social Work is seeking post-discharge placement for this patient at the following level of care:   SKILLED NURSING   (*CSW will update this form in Epic as items are completed)   07/23/2014  Patient/family provided with [name] Department of Clinical Social Work's list of facilities offering this level of care within the geographic area requested by the patient (or if unable, by the patient's family).  07/23/2014  Patient/family informed of their freedom to choose among providers that offer the needed level of care, that participate in Medicare, Medicaid or managed care program needed by the patient, have an available bed and are willing to accept the patient.  07/23/2014  Patient/family informed of [facility]' ownership interest in [facility], as well as of the fact that they are under no obligation to receive care at this facility.  PASARR submitted to [employer] on 07/23/2014 PASARR number received on   FL2 transmitted to all facilities in geographic area requested by pt/family on  07/23/2014 FL2 transmitted to all facilities within larger geographic area on   Patient informed that his/her managed care company has contracts with or will negotiate with  certain facilities, including the following:   YES     Patient/family informed of bed offers received:  07/26/2014 [facility]  Physician recommends and patient chooses bed at    [facility]  on 07/26/2014 Patient to be transferred to facility by [facility]  Patient and family notified of transfer on 07/26/2014 Name of family member notified: [name], [name]   The  following physician request were entered in Epic:   Additional Comments:   Derenda FennelBashira Kieara Schwark, MSW, LCSWA [phone] 07/23/2014 2:01 PM

## 2014-07-23 NOTE — Progress Notes (Signed)
Speech Language Pathology Treatment: Dysphagia;Cognitive-Linquistic  Patient Details Name: Barry MattocksJimmy Tatham MRN: [id number] DOB: 1941-01-20 Today's Date: 07/23/2014 Time: [id number] SLP Time Calculation (min) (ACUTE ONLY): 15 min  Assessment / Plan / Recommendation Clinical Impression  Pt seen for f/u cognitive and dysphagia treatment. SLP provided Mod cues for slower, more controlled rate with advanced trials of thin liquids. Despite interventions, pt continues to have a strong, reflexive cough response consistently following cup sips. No coughing is observed with nectar thick liquids, despite impulsivity with intake. Recommend to continue Dys 3 diet and nectar thick liquids.  Pt remains oriented to person only this afternoon, requiring Total A for orientation to location. SLP provided Mod cues for sustained attention and safety awareness throughout intake. Continue to recommend f/u at SNF.   HPI HPI: Barry MattocksJimmy Picchi is a 74 y.o. male with PMH significant for DM type II and dementia, [facility] evaluation of left sided weakness. MRI positive for acute RIGHT thalamus/internal capsule lacunar infarct.   Pertinent Vitals Pain Assessment: No/denies pain Faces Pain Scale: Hurts a little bit Pain Location: back Pain Descriptors / Indicators: Sore Pain Intervention(s): Monitored during session;Repositioned  SLP Plan  Continue with current plan of care    Recommendations Diet recommendations: Dysphagia 3 (mechanical soft);Nectar-thick liquid Liquids provided via: Cup;No straw Medication Administration: Whole meds with puree Supervision: Patient able to self feed;Full supervision/cueing for compensatory strategies Compensations: Slow rate;Small sips/bites;Follow solids with liquid (check for residue) Postural Changes and/or Swallow Maneuvers: Seated upright 90 degrees       Oral Care Recommendations: Oral care BID Follow up Recommendations: Skilled Nursing  facility;24 hour supervision/assistance Plan: Continue with current plan of care    GO      Maxcine HamLaura Paiewonsky, M.A. CCC-SLP [phone]  Maxcine Hamaiewonsky, Ahlivia Salahuddin 07/23/2014, 2:30 PM

## 2014-07-23 NOTE — Progress Notes (Signed)
Subjective: Patient pleasantly demented, no complaints.   Objective: Vital signs in last 24 hours: Filed Vitals:   07/22/14 1806 07/22/14 2138 07/23/14 0042 07/23/14 0458  BP: 171/80 160/69 132/58   Pulse: 83 70 74 66  Temp: 97.9 F (36.6 C) 98.3 F (36.8 C) 98.7 F (37.1 C) 98.7 F (37.1 C)  TempSrc: Oral Oral Oral Oral  Resp: 20 20 18 20   Height:      Weight:      SpO2: 95% 99% 93% 95%   Weight change:  No intake or output data in the 24 hours ending 07/23/14 [id number]  Physical Exam: General: Elderly white male, alert, cooperative, NAD. Disoriented.  HEENT: PERRL, EOMI. Moist mucus membranes Neck: Full range of motion without pain, supple, no lymphadenopathy or carotid bruits Lungs: Clear to ascultation bilaterally, normal work of respiration, no wheezes, rales, rhonchi Heart: RRR, no murmurs, gallops, or rubs Abdomen: Soft, non-tender, non-distended, BS + Extremities: No cyanosis, clubbing, or edema Neurologic: Alert & oriented x1, cranial nerves II-XII intact, sensation intact to light touch. UE's w/ 5/5 strength in UE's bilaterally. 4/5 strength in RLE, 3/5 in LLE..    Lab Results: Basic Metabolic Panel:  Recent Labs Lab 07/21/14 2020 07/22/14 0749  NA 139 142  K 4.0 3.9  CL 105 109  CO2 21 24  GLUCOSE 173* 99  BUN 57* 57*  CREATININE 2.90* 2.56*  CALCIUM 9.5 9.4  MG  --  2.0   CBC:  Recent Labs Lab 07/21/14 2020 07/22/14 0749  WBC 12.9* 9.0  NEUTROABS 10.5*  --   HGB 10.1* 9.7*  HCT 30.5* 29.2*  MCV 87.9 88.8  PLT 256 233   CBG:  Recent Labs Lab 07/22/14 0404 07/22/14 0802 07/22/14 1312 07/22/14 1656 07/22/14 2151  GLUCAP 80 83 109* 183* 183*   Fasting Lipid Panel:  Recent Labs Lab 07/22/14 0530  CHOL 180  HDL 34*  LDLCALC 108*  TRIG 191*  CHOLHDL 5.3   Urinalysis:  Recent Labs Lab 07/19/14 1757 07/21/14 2130  COLORURINE YELLOW YELLOW  LABSPEC 1.021 1.027  PHURINE 5.0 5.0  GLUCOSEU NEGATIVE NEGATIVE  HGBUR TRACE*  NEGATIVE  BILIRUBINUR NEGATIVE SMALL*  KETONESUR NEGATIVE 15*  PROTEINUR >300* >300*  UROBILINOGEN 0.2 0.2  NITRITE NEGATIVE NEGATIVE  LEUKOCYTESUR TRACE* NEGATIVE   Studies/Results: Dg Chest 2 View  07/22/2014   CLINICAL DATA:  Stroke like symptoms.  EXAM: CHEST  2 VIEW  COMPARISON:  07/19/2014.  FINDINGS: There is lung base opacity, greater on the right, most evident in the posterior right lower lobe on the lateral view. This may all be atelectasis. Pneumonia should be considered in the proper clinical setting.  Mild interstitial prominence is seen bilaterally. This is similar to the prior exam allowing for differences in technique. It may all be chronic. Mild interstitial edema is possible.  Cardiac silhouette borderline enlarged. Aorta is mildly uncoiled. No mediastinal or hilar masses. The bony thorax is diffusely demineralized but grossly intact.  IMPRESSION: 1. Similar appearance of the chest radiograph to the prior exam. 2. Are right lower lobe opacity best seen posteriorly on the lateral view, is likely atelectasis. Pneumonia should be considered if there are consistent clinical symptoms. 3. Mild interstitial prominence is likely chronic. Consider mild interstitial edema if there shortness of breath.   Electronically Signed   By: Amie Portlandavid  Ormond M.D.   On: 07/22/2014 00:38   Ct Head Wo Contrast  07/21/2014   CLINICAL DATA:  Left sided weakness.  EXAM: CT HEAD  WITHOUT CONTRAST  TECHNIQUE: Contiguous axial images were obtained from the base of the skull through the vertex without intravenous contrast.  COMPARISON:  07/16/2014  FINDINGS: Ventricles normal in overall configuration. There is ventricular and sulcal enlargement reflecting moderate atrophy. Several small lacune infarcts are noted in the basal ganglia and central white matter. Patchy white matter hypoattenuation is noted elsewhere consistent with chronic microvascular ischemic change.  No parenchymal masses or mass effect. There is no  evidence of a recent cortical infarct.  There are no extra-axial masses or abnormal fluid collections.  There is no intracranial hemorrhage.  Visualized sinuses and mastoid air cells are clear. No skull lesion.  IMPRESSION: 1. No acute intracranial abnormalities. 2. No change from the recent prior CT scan the brain.   Electronically Signed   By: Amie Portland M.D.   On: 07/21/2014 22:33   Mr Brain Wo Contrast  07/22/2014   CLINICAL DATA:  Nursing home patient noted to have LEFT-sided weakness at noon today, fell 2 days ago. Chest pain.  EXAM: MRI HEAD WITHOUT CONTRAST  MRA HEAD WITHOUT CONTRAST  TECHNIQUE: Multiplanar, multiecho pulse sequences of the brain and surrounding structures were obtained without intravenous contrast. Angiographic images of the head were obtained using MRA technique without contrast.  COMPARISON:  CT of the head July 29, 2014 at 2223 hr  FINDINGS: MRI HEAD FINDINGS  16 mm ovoid focus of reduced diffusion within the RIGHT thalamus/ posterior limb of the internal capsule, corresponding low ADC values consistent with acute process. No susceptibility artifact to suggest hemorrhage.  Patchy supratentorial, to lesser extent pontine T2 hyperintensities, with cystic changes within the pontine consistent with remote infarcts. Remote bilateral basal ganglia, bilateral thalamus lacunar infarcts. Remote bilateral small cerebellar infarcts. Moderate to severe ventriculomegaly, likely on the basis of global parenchymal brain volume loss as there is overall enlargement of the cerebral sulci and cerebellar folia. No midline shift or mass effect, No mass lesions. Mild mid brain atrophy though, the tectum appears normal.  No abnormal extra-axial fluid collections. Ocular globes and orbital contents are unremarkable though not tailored for evaluation. Trace paranasal sinus mucosal thickening without air-fluid levels. The mastoid air cells are well aerated. No abnormal sellar expansion. No cerebellar  tonsillar ectopia.  MRA HEAD FINDINGS  Anterior circulation: Normal flow related enhancement of the included cervical, petrous, cavernous and supra clinoid internal carotid arteries. Patent anterior communicating artery. Supernumerary RIGHT anterior cerebral artery arising from RIGHT A1-2 junction, normal variant. Normal flow related enhancement of the proximal anterior and middle cerebral arteries, including more distal segments. Mild luminal irregularity mid and distal branches. Focal mid to high-grade stenosis RIGHT M3/4 branch and distal RIGHT A2.  No large vessel occlusion, high-grade stenosis, aneurysm.  Posterior circulation: RIGHT vertebral artery is dominant. Focal luminal irregularity of the intradural RIGHT vertebral artery, axial of 22/154. Basilar artery is patent, with normal flow related enhancement of the main branch vessels. Normal flow related enhancement of the posterior cerebral arteries. Mild luminal irregularity of the mid and distal branches.  No large vessel occlusion, high-grade stenosis, aneurysm.  IMPRESSION: MRI HEAD: Acute RIGHT thalamus/internal capsule lacunar infarct.  Remote bilateral basal ganglia, thalamus lacunar infarcts. Remote pontine and cerebellar small infarcts. Moderate to severe white matter changes consistent with chronic small vessel ischemic disease.  Mild thinning of the midbrain, in a background of moderate to severe global parenchymal brain volume loss.  MRA HEAD: No large vessel occlusion.  Luminal irregularity of the RIGHT vertebral artery may reflect chronic  dissection without stenosis, possible infundibulum.  Luminal irregularity of the cerebral arteries most consistent with atherosclerosis with mid high-grade stenosis RIGHT M3/4 branch and distal RIGHT A2.   Electronically Signed   By: Awilda Metro   On: 07/22/2014 01:45   Mr Maxine Glenn Head/brain Wo Cm  07/22/2014   CLINICAL DATA:  Nursing home patient noted to have LEFT-sided weakness at noon today, fell 2  days ago. Chest pain.  EXAM: MRI HEAD WITHOUT CONTRAST  MRA HEAD WITHOUT CONTRAST  TECHNIQUE: Multiplanar, multiecho pulse sequences of the brain and surrounding structures were obtained without intravenous contrast. Angiographic images of the head were obtained using MRA technique without contrast.  COMPARISON:  CT of the head July 29, 2014 at 2223 hr  FINDINGS: MRI HEAD FINDINGS  16 mm ovoid focus of reduced diffusion within the RIGHT thalamus/ posterior limb of the internal capsule, corresponding low ADC values consistent with acute process. No susceptibility artifact to suggest hemorrhage.  Patchy supratentorial, to lesser extent pontine T2 hyperintensities, with cystic changes within the pontine consistent with remote infarcts. Remote bilateral basal ganglia, bilateral thalamus lacunar infarcts. Remote bilateral small cerebellar infarcts. Moderate to severe ventriculomegaly, likely on the basis of global parenchymal brain volume loss as there is overall enlargement of the cerebral sulci and cerebellar folia. No midline shift or mass effect, No mass lesions. Mild mid brain atrophy though, the tectum appears normal.  No abnormal extra-axial fluid collections. Ocular globes and orbital contents are unremarkable though not tailored for evaluation. Trace paranasal sinus mucosal thickening without air-fluid levels. The mastoid air cells are well aerated. No abnormal sellar expansion. No cerebellar tonsillar ectopia.  MRA HEAD FINDINGS  Anterior circulation: Normal flow related enhancement of the included cervical, petrous, cavernous and supra clinoid internal carotid arteries. Patent anterior communicating artery. Supernumerary RIGHT anterior cerebral artery arising from RIGHT A1-2 junction, normal variant. Normal flow related enhancement of the proximal anterior and middle cerebral arteries, including more distal segments. Mild luminal irregularity mid and distal branches. Focal mid to high-grade stenosis RIGHT  M3/4 branch and distal RIGHT A2.  No large vessel occlusion, high-grade stenosis, aneurysm.  Posterior circulation: RIGHT vertebral artery is dominant. Focal luminal irregularity of the intradural RIGHT vertebral artery, axial of 22/154. Basilar artery is patent, with normal flow related enhancement of the main branch vessels. Normal flow related enhancement of the posterior cerebral arteries. Mild luminal irregularity of the mid and distal branches.  No large vessel occlusion, high-grade stenosis, aneurysm.  IMPRESSION: MRI HEAD: Acute RIGHT thalamus/internal capsule lacunar infarct.  Remote bilateral basal ganglia, thalamus lacunar infarcts. Remote pontine and cerebellar small infarcts. Moderate to severe white matter changes consistent with chronic small vessel ischemic disease.  Mild thinning of the midbrain, in a background of moderate to severe global parenchymal brain volume loss.  MRA HEAD: No large vessel occlusion.  Luminal irregularity of the RIGHT vertebral artery may reflect chronic dissection without stenosis, possible infundibulum.  Luminal irregularity of the cerebral arteries most consistent with atherosclerosis with mid high-grade stenosis RIGHT M3/4 branch and distal RIGHT A2.   Electronically Signed   By: Awilda Metro   On: 07/22/2014 01:45   Medications: I have reviewed the patient's current medications. Scheduled Meds: . aspirin  300 mg Rectal Daily   Or  . aspirin  325 mg Oral Daily  . atorvastatin  80 mg Oral q1800  . donepezil  10 mg Oral Daily  . heparin  5,000 Units Subcutaneous 3 times per day  . insulin aspart  0-15 Units Subcutaneous TID WC  . LORazepam  0.5 mg Oral TID   Continuous Infusions:  PRN Meds:.acetaminophen, food thickener, senna-docusate Assessment/Plan: 74 y/o M w/ PMHx of dementia, HTN, and DM type II, admitted for right thalamic/internal capsule CVA.  Right Thalamus/Internal Capsule Lacunar Infarct: MRI shows acute right thalamus/internal capsule  lacunar infarct and no large vessel occlusion. Carotid dopplers show 1-39% ICA stenosis bilaterally. Fasting lipid panel shows cholesterol 180, triglycerides 191, HDL 34, LDL 108. ECHO performed yesterday shows EF of 60-65% w/ trivial aortic regurgitation and mild mitral regurgitation. HbA1c 6.0. PT/OT recommend SNF. -Appreciate Neurology following -Continue ASA 325 mg daily.  -Risk factor modification -Started Lipitor 40 mg daily   Leukocytosis: Resolved.  -CBC in AM  Acute on CKD: Improving. Cr trend as follows:   Recent Labs Lab 07/21/14 1937 07/21/14 2020 07/22/14 0749  CREATININE 2.70* 2.90* 2.56*  -Repeat BMP in AM  DM2: HbA1c 6.0. On metformin 1000 mg bid, januvia 100 mg daily and lantus 16 U daily. CBG trend as follows:  Recent Labs Lab 07/22/14 1656 07/22/14 2151 07/23/14 0629 07/23/14 1111 07/23/14 1619  GLUCAP 183* 183* 183* 219* 249*  -Hold home medications; may start low dose Lantus if CBG's continue to be elevated in the 200's.  -SSI-M  Hypertension: Currently 145/81. On carvedilol 12.5 mg bid, losartan-HCTZ 100-12.5 mg daily at home.  - Holding home meds for permissive hypertension  Anemia: Hb 9.7 most recently. Takes ferrous sulfate 325 mg bid at home for presumed iron deficiency anemia. -Repeat CBC in AM  Advanced Dementia: A&O x1. Currently on donepezil 10 mg daily at home. -Continue Aricept  Diet: SLP also recommends SNF. -Dysphagia 3  DVT Ppx: Heparin [place]  Dispo: Disposition is deferred at this time, awaiting improvement of current medical problems.  Anticipated discharge in approximately 2-3 day(s). Pending SNF placement. Cannot return to ALF, requires increased level of care at this time.   The patient does have a current PCP (Ron Parker, MD) and does not need an [facility] hospital follow-up appointment after discharge.  The patient does have transportation limitations that hinder transportation to clinic appointments.  .Services Needed at time of  discharge: Y = Yes, Blank = No PT: SNF  OT: SNF  RN:   Equipment:   Other:     LOS: 2 days   Courtney Paris, MD 07/23/2014, 6:24 AM

## 2014-07-24 LAB — BASIC METABOLIC PANEL
ANION GAP: 8 (ref 5–15)
BUN: 30 mg/dL — ABNORMAL HIGH (ref 6–23)
CHLORIDE: 109 mmol/L (ref 96–112)
CO2: 24 mmol/L (ref 19–32)
CREATININE: 1.48 mg/dL — AB (ref 0.50–1.35)
Calcium: 9.4 mg/dL (ref 8.4–10.5)
GFR calc Af Amer: 52 mL/min — ABNORMAL LOW (ref >90)
GFR calc non Af Amer: 45 mL/min — ABNORMAL LOW (ref >90)
GLUCOSE: 210 mg/dL — AB (ref 70–99)
POTASSIUM: 3.7 mmol/L (ref 3.5–5.1)
SODIUM: 141 mmol/L (ref 135–145)

## 2014-07-24 LAB — GLUCOSE, CAPILLARY
Glucose-Capillary: 211 mg/dL — ABNORMAL HIGH (ref 70–99)
Glucose-Capillary: 264 mg/dL — ABNORMAL HIGH (ref 70–99)
Glucose-Capillary: 128 mg/dL — ABNORMAL HIGH (ref 70–99)
Glucose-Capillary: 281 mg/dL — ABNORMAL HIGH (ref 70–99)

## 2014-07-24 LAB — CBC
HCT: 29.4 % — ABNORMAL LOW (ref 39.0–52.0)
HEMOGLOBIN: 10.1 g/dL — AB (ref 13.0–17.0)
MCH: 29.7 pg (ref 26.0–34.0)
MCHC: 34.4 g/dL (ref 30.0–36.0)
MCV: 86.5 fL (ref 78.0–100.0)
PLATELETS: 281 10*3/uL (ref 150–400)
RBC: 3.4 MIL/uL — AB (ref 4.22–5.81)
RDW: 13.7 % (ref 11.5–15.5)
WBC: 5.4 10*3/uL (ref 4.0–10.5)

## 2014-07-24 MED ORDER — INSULIN GLARGINE 100 UNIT/ML ~~LOC~~ SOLN
8.0000 [IU] | Freq: Every day | SUBCUTANEOUS | Status: DC
  Administered 2014-07-24 – 2014-07-25 (×2): 8 [IU] via SUBCUTANEOUS
  Filled 2014-07-24 (×2): qty 0.08

## 2014-07-24 NOTE — Progress Notes (Signed)
Subjective: Doing well this AM. No complaints. Still A&O x1. Neuro exam unchanged from yesterday.   Objective: Vital signs in last 24 hours: Filed Vitals:   07/23/14 1323 07/23/14 1741 07/23/14 2133 07/24/14 0240  BP: 154/75 174/73 134/76 154/66  Pulse: 73 75 73 69  Temp: 99.3 F (37.4 C) 97.5 F (36.4 C) 98.1 F (36.7 C) 98.3 F (36.8 C)  TempSrc: Oral Oral Oral Oral  Resp: Height:      Weight:      SpO2: 96% 96% 93% 98%   Weight change:   Intake/Output Summary (Last 24 hours) at 07/24/14 0703 Last data filed at 07/23/14 1700  Gross per 24 hour  Intake    820 ml  Output      0 ml  Net    820 ml    Physical Exam: General: Elderly white male, alert, cooperative, NAD. Disoriented.  HEENT: PERRL, EOMI. Moist mucus membranes Neck: Full range of motion without pain, supple, no lymphadenopathy or carotid bruits Lungs: Clear to ascultation bilaterally, normal work of respiration, no wheezes, rales, rhonchi Heart: RRR, no murmurs, gallops, or rubs Abdomen: Soft, non-tender, non-distended, BS + Extremities: No cyanosis, clubbing, or edema Neurologic: Alert & oriented x1, cranial nerves II-XII intact, sensation intact to light touch. UE's w/ 5/5 strength in UE's bilaterally. 4/5 strength in RLE, 3/5 in LLE..    Lab Results: Basic Metabolic Panel:  Recent Labs Lab 07/21/14 2020 07/22/14 0749  NA 139 142  K 4.0 3.9  CL 105 109  CO2 21 24  GLUCOSE 173* 99  BUN 57* 57*  CREATININE 2.90* 2.56*  CALCIUM 9.5 9.4  MG  --  2.0   CBC:  Recent Labs Lab 07/21/14 2020 07/22/14 0749  WBC 12.9* 9.0  NEUTROABS 10.5*  --   HGB 10.1* 9.7*  HCT 30.5* 29.2*  MCV 87.9 88.8  PLT 256 233   CBG:  Recent Labs Lab 07/22/14 1656 07/22/14 2151 07/23/14 0629 07/23/14 1111 07/23/14 1619 07/23/14 2118  GLUCAP 183* 183* 183* 219* 249* 218*   Fasting Lipid Panel:  Recent Labs Lab 07/22/14 0530  CHOL 180  HDL 34*  LDLCALC 108*  TRIG 191*  CHOLHDL 5.3     Urinalysis:  Recent Labs Lab 07/19/14 1757 07/21/14 2130  COLORURINE YELLOW YELLOW  LABSPEC 1.021 1.027  PHURINE 5.0 5.0  GLUCOSEU NEGATIVE NEGATIVE  HGBUR TRACE* NEGATIVE  BILIRUBINUR NEGATIVE SMALL*  KETONESUR NEGATIVE 15*  PROTEINUR >300* >300*  UROBILINOGEN 0.2 0.2  NITRITE NEGATIVE NEGATIVE  LEUKOCYTESUR TRACE* NEGATIVE    Medications: I have reviewed the patient's current medications. Scheduled Meds: . aspirin  300 mg Rectal Daily   Or  . aspirin  325 mg Oral Daily  . atorvastatin  40 mg Oral q1800  . donepezil  10 mg Oral Daily  . heparin  5,000 Units Subcutaneous 3 times per day  . insulin aspart  0-15 Units Subcutaneous TID WC  . LORazepam  0.5 mg Oral TID   Continuous Infusions:  PRN Meds:.acetaminophen, food thickener, senna-docusate   Assessment/Plan: 74 y/o M w/ PMHx of dementia, HTN, and DM type II, admitted for right thalamic/internal capsule CVA.  Right Thalamus/Internal Capsule Lacunar Infarct: Neuro exam unchanged today. No complaints today.  -Appreciate Neurology following -Continue ASA 325 mg daily.  -Continue Lipitor 40 mg daily  -Pending SNF placement  Leukocytosis: Resolved.  -CBC in AM  Acute on CKD: Improving. Cr trend as follows:   Recent Labs Lab 07/21/14  1937 07/21/14 2020 07/22/14 0749  CREATININE 2.70* 2.90* 2.56*  -Repeat BMP in AM  DM2: HbA1c 6.0. On metformin 1000 mg bid, januvia 100 mg daily and lantus 16 U daily. CBG trend as follows:  Recent Labs Lab 07/22/14 2151 07/23/14 0629 07/23/14 1111 07/23/14 1619 07/23/14 2118  GLUCAP 183* 183* 219* 249* 218*  -Restart Lantus 8 units daily. -SSI-M  Hypertension: Currently 145/81. On carvedilol 12.5 mg bid, losartan-HCTZ 100-12.5 mg daily at home.  -Holding home meds for permissive hypertension -May restart Coreg in AM  Anemia: Hb 9.7 most recently. Takes ferrous sulfate 325 mg bid at home for presumed iron deficiency anemia. -Repeat CBC in AM  Advanced  Dementia: A&O x1. Currently on donepezil 10 mg daily at home. -Continue Aricept  Diet: SLP also recommends SNF. -Dysphagia 3  DVT Ppx: Heparin [place]  Dispo: Disposition is deferred at this time, awaiting improvement of current medical problems.  Anticipated discharge in approximately 2-3 day(s). Pending SNF placement. Cannot return to ALF, requires increased level of care at this time.   The patient does have a current PCP (Ron ParkerSamuel Bowen, MD) and [facility] after [facility].  The patient does have transportation limitations that hinder transportation to clinic appointments.  .Services Needed at time of discharge: Y = Yes, Blank = No PT: SNF  OT: SNF  RN:   Equipment:   Other:     LOS: 3 days   Courtney ParisEden W Rivaan Kendall, MD 07/24/2014, 7:03 AM

## 2014-07-25 LAB — GLUCOSE, CAPILLARY
GLUCOSE-CAPILLARY: 210 mg/dL — AB (ref 70–99)
Glucose-Capillary: 240 mg/dL — ABNORMAL HIGH (ref 70–99)
Glucose-Capillary: 294 mg/dL — ABNORMAL HIGH (ref 70–99)

## 2014-07-25 MED ORDER — CARVEDILOL 12.5 MG PO TABS
12.5000 mg | ORAL_TABLET | Freq: Two times a day (BID) | ORAL | Status: DC
  Administered 2014-07-25 – 2014-07-26 (×3): 12.5 mg via ORAL
  Filled 2014-07-25 (×3): qty 1

## 2014-07-25 MED ORDER — INSULIN GLARGINE 100 UNIT/ML ~~LOC~~ SOLN
10.0000 [IU] | Freq: Every day | SUBCUTANEOUS | Status: DC
  Administered 2014-07-26: 10 [IU] via SUBCUTANEOUS
  Filled 2014-07-25: qty 0.1

## 2014-07-25 NOTE — Progress Notes (Signed)
Subjective: Somewhat agitated today, otherwise doing well.   Objective: Vital signs in last 24 hours: Filed Vitals:   07/24/14 1823 07/24/14 2135 07/25/14 0121 07/25/14 0700  BP: 136/82 165/88 161/87 139/77  Pulse: 73 80 74 65  Temp: 98.1 F (36.7 C) 97.8 F (36.6 C) 97.6 F (36.4 C) 97.6 F (36.4 C)  TempSrc: Oral Oral Oral Oral  Resp: 18 18 22 20   Height:      Weight:      SpO2: 98%  96% 95%   Weight change:   Intake/Output Summary (Last 24 hours) at 07/25/14 0810 Last data filed at 07/24/14 2228  Gross per 24 hour  Intake   1080 ml  Output      0 ml  Net   1080 ml    Physical Exam: General: Elderly white male, alert, cooperative, NAD. Disoriented.  HEENT: PERRL, EOMI. Moist mucus membranes Neck: Full range of motion without pain, supple, no lymphadenopathy or carotid bruits Lungs: Clear to ascultation bilaterally, normal work of respiration, no wheezes, rales, rhonchi Heart: RRR, no murmurs, gallops, or rubs Abdomen: Soft, non-tender, non-distended, BS + Extremities: No cyanosis, clubbing, or edema Neurologic: Alert & oriented x1, cranial nerves II-XII intact, sensation intact to light touch. UE's w/ 5/5 strength in UE's bilaterally. 4/5 strength in RLE, 3/5 in LLE..    Lab Results: Basic Metabolic Panel:  Recent Labs Lab 07/22/14 0749 07/24/14 0936  NA 142 141  K 3.9 3.7  CL 109 109  CO2 24 24  GLUCOSE 99 210*  BUN 57* 30*  CREATININE 2.56* 1.48*  CALCIUM 9.4 9.4  MG 2.0  --    CBC:  Recent Labs Lab 07/21/14 2020 07/22/14 0749 07/24/14 0936  WBC 12.9* 9.0 5.4  NEUTROABS 10.5*  --   --   HGB 10.1* 9.7* 10.1*  HCT 30.5* 29.2* 29.4*  MCV 87.9 88.8 86.5  PLT 256 233 281   CBG:  Recent Labs Lab 07/23/14 2118 07/24/14 0700 07/24/14 1137 07/24/14 1722 07/24/14 2110 07/25/14 0653  GLUCAP 218* 211* 264* 128* 281* 210*   Fasting Lipid Panel:  Recent Labs Lab 07/22/14 0530  CHOL 180  HDL 34*  LDLCALC 108*  TRIG 191*  CHOLHDL  5.3   Urinalysis:  Recent Labs Lab 07/19/14 1757 07/21/14 2130  COLORURINE YELLOW YELLOW  LABSPEC 1.021 1.027  PHURINE 5.0 5.0  GLUCOSEU NEGATIVE NEGATIVE  HGBUR TRACE* NEGATIVE  BILIRUBINUR NEGATIVE SMALL*  KETONESUR NEGATIVE 15*  PROTEINUR >300* >300*  UROBILINOGEN 0.2 0.2  NITRITE NEGATIVE NEGATIVE  LEUKOCYTESUR TRACE* NEGATIVE    Medications: I have reviewed the patient's current medications. Scheduled Meds: . aspirin  300 mg Rectal Daily   Or  . aspirin  325 mg Oral Daily  . atorvastatin  40 mg Oral q1800  . donepezil  10 mg Oral Daily  . heparin  5,000 Units Subcutaneous 3 times per day  . insulin aspart  0-15 Units Subcutaneous TID WC  . insulin glargine  8 Units Subcutaneous Daily  . LORazepam  0.5 mg Oral TID   Continuous Infusions:  PRN Meds:.acetaminophen, food thickener, senna-docusate   Assessment/Plan: 74 y/o M w/ PMHx of dementia, HTN, and DM type II, admitted for right thalamic/internal capsule CVA.  Right Thalamus/Internal Capsule Lacunar Infarct: Neuro exam unchanged today. No complaints. -Continue ASA 325 mg daily.  -Continue Lipitor 40 mg daily  -Pending SNF placement  Acute on CKD: Improving. Cr trend as follows:   Recent Labs Lab 07/21/14 1937 07/21/14 2020 07/22/14  1610 07/24/14 0936  CREATININE 2.70* 2.90* 2.56* 1.48*  -Repeat BMP in AM  DM2: HbA1c 6.0. On metformin 1000 mg bid, januvia 100 mg daily and lantus 16 U daily. CBG trend as follows:  Recent Labs Lab 07/24/14 0700 07/24/14 1137 07/24/14 1722 07/24/14 2110 07/25/14 0653  GLUCAP 211* 264* 128* 281* 210*  -Increase Lantus to 10 units.  -Continue SSI-M  Hypertension: Currently 169/79. On carvedilol 12.5 mg bid, losartan-HCTZ 100-12.5 mg daily at home. Has been >72 hours since onset of CVA symptoms.  -Restart Coreg 12.5 mg bid -Evaluate for further management in the AM.   Anemia: Hb 10.1 most recently. Takes ferrous sulfate 325 mg bid at home for presumed iron  deficiency anemia. -Repeat CBC in AM  Advanced Dementia: A&O x1. Currently on donepezil 10 mg daily at home. -Continue Aricept  Diet: SLP also recommends SNF. -Dysphagia 3  DVT Ppx: Heparin [place]  Dispo: Disposition is deferred at this time, awaiting improvement of current medical problems.  Anticipated discharge in approximately 2-3 day(s). Pending SNF placement. Cannot return to ALF, requires increased level of care at this time.   The patient does have a current PCP (Ron Parker, MD) and does not need an [facility] hospital follow-up appointment after discharge.  The patient does have transportation limitations that hinder transportation to clinic appointments.  .Services Needed at time of discharge: Y = Yes, Blank = No PT: SNF  OT: SNF  RN:   Equipment:   Other:     LOS: 4 days   Courtney Paris, MD 07/25/2014, 8:10 AM

## 2014-07-26 LAB — BASIC METABOLIC PANEL
ANION GAP: 7 (ref 5–15)
BUN: 34 mg/dL — ABNORMAL HIGH (ref 6–23)
CO2: 27 mmol/L (ref 19–32)
Calcium: 9.4 mg/dL (ref 8.4–10.5)
Chloride: 109 mmol/L (ref 96–112)
Creatinine, Ser: 1.61 mg/dL — ABNORMAL HIGH (ref 0.50–1.35)
GFR calc Af Amer: 47 mL/min — ABNORMAL LOW (ref >90)
GFR calc non Af Amer: 41 mL/min — ABNORMAL LOW (ref >90)
GLUCOSE: 297 mg/dL — AB (ref 70–99)
Potassium: 4.2 mmol/L (ref 3.5–5.1)
Sodium: 143 mmol/L (ref 135–145)

## 2014-07-26 LAB — GLUCOSE, CAPILLARY
GLUCOSE-CAPILLARY: 225 mg/dL — AB (ref 70–99)
GLUCOSE-CAPILLARY: 234 mg/dL — AB (ref 70–99)
GLUCOSE-CAPILLARY: 272 mg/dL — AB (ref 70–99)
Glucose-Capillary: 231 mg/dL — ABNORMAL HIGH (ref 70–99)

## 2014-07-26 MED ORDER — ATORVASTATIN CALCIUM 40 MG PO TABS: 40.0000 mg | ORAL_TABLET | Freq: Every day | ORAL | Status: DC

## 2014-07-26 MED ORDER — LOSARTAN POTASSIUM 100 MG PO TABS: 100.0000 mg | ORAL_TABLET | Freq: Every day | ORAL | Status: AC

## 2014-07-26 MED ORDER — INSULIN GLARGINE 100 UNIT/ML ~~LOC~~ SOLN: 16.0000 [IU] | Freq: Every day | SUBCUTANEOUS | Status: DC

## 2014-07-26 MED ORDER — ATORVASTATIN CALCIUM 40 MG PO TABS: 40.0000 mg | ORAL_TABLET | Freq: Every day | ORAL | Status: AC

## 2014-07-26 MED ORDER — LOSARTAN POTASSIUM 100 MG PO TABS: 100.0000 mg | ORAL_TABLET | Freq: Every day | ORAL | Status: DC

## 2014-07-26 MED ORDER — LOSARTAN POTASSIUM 50 MG PO TABS
100.0000 mg | ORAL_TABLET | Freq: Every day | ORAL | Status: DC
  Administered 2014-07-26: 100 mg via ORAL
  Filled 2014-07-26: qty 2

## 2014-07-26 MED ORDER — ASPIRIN 325 MG PO TABS: 325.0000 mg | ORAL_TABLET | Freq: Every day | ORAL | Status: AC

## 2014-07-26 MED ORDER — ASPIRIN 325 MG PO TABS: 325.0000 mg | ORAL_TABLET | Freq: Every day | ORAL | Status: DC

## 2014-07-26 NOTE — Progress Notes (Signed)
SLP Cancellation Note  Patient Details Name: Barry MattocksJimmy Billig MRN: [id number] DOB: May 17, 1941   Cancelled treatment:       Reason Eval/Treat Not Completed: New orders received for repeat swallow assessment. RN reports that patient just fell asleep, and requests that SLP return at a later time to allow him to rest. Per RN, pt has not been having overt difficulties with current diet and has had good intake. Will continue to follow as able.    Maxcine HamLaura Paiewonsky, M.A. CCC-SLP [phone]  Maxcine Hamaiewonsky, [place] 07/26/2014, 11:24 AM

## 2014-07-26 NOTE — Clinical Social Work Note (Signed)
Patient has a bed at [facility]. Facility will contact pt's dtr, Misty StanleyLisa to complete admissions paperwork.  CSW has notified MD for discharge summary.   CSW continues to follow pt and facilitate pt's discharge needs.   Derenda FennelBashira Kyndel Egger, MSW, LCSWA [phone] 07/26/2014 1:20 PM

## 2014-07-26 NOTE — Progress Notes (Signed)
Subjective: Doing very well today, no complaints.   Objective: Vital signs in last 24 hours: Filed Vitals:   07/25/14 2154 07/26/14 0209 07/26/14 0535 07/26/14 0926  BP: 140/77 157/71 169/83 151/80  Pulse: 79 71 122 77  Temp: 98.4 F (36.9 C) 98 F (36.7 C) 97.4 F (36.3 C) 98 F (36.7 C)  TempSrc: Oral Oral Oral Oral  Resp: Height:      Weight:      SpO2: 98% 96% 95% 95%   Weight change:   Intake/Output Summary (Last 24 hours) at 07/26/14 1155 Last data filed at 07/26/14 1610  Gross per 24 hour  Intake    360 ml  Output    700 ml  Net   -340 ml    Physical Exam: General: Elderly white male, alert, cooperative, NAD. Disoriented.  HEENT: PERRL, EOMI. Moist mucus membranes Neck: Full range of motion without pain, supple, no lymphadenopathy or carotid bruits Lungs: Clear to ascultation bilaterally, normal work of respiration, no wheezes, rales, rhonchi Heart: RRR, no murmurs, gallops, or rubs Abdomen: Soft, non-tender, non-distended, BS + Extremities: No cyanosis, clubbing, or edema Neurologic: Alert & oriented x1, cranial nerves II-XII intact, sensation intact to light touch. UE's w/ 5/5 strength in UE's bilaterally. 4/5 strength in RLE, 3/5 in LLE..   Lab Results: Basic Metabolic Panel:  Recent Labs Lab 07/22/14 0749 07/24/14 0936 07/26/14 0414  NA 142 141 143  K 3.9 3.7 4.2  CL 109 109 109  CO2 GLUCOSE 99 210* 297*  BUN 57* 30* 34*  CREATININE 2.56* 1.48* 1.61*  CALCIUM 9.4 9.4 9.4  MG 2.0  --   --    CBC:  Recent Labs Lab 07/21/14 2020 07/22/14 0749 07/24/14 0936  WBC 12.9* 9.0 5.4  NEUTROABS 10.5*  --   --   HGB 10.1* 9.7* 10.1*  HCT 30.5* 29.2* 29.4*  MCV 87.9 88.8 86.5  PLT 256 233 281   CBG:  Recent Labs Lab 07/25/14 0653 07/25/14 1124 07/25/14 1648 07/25/14 2215 07/26/14 0711 07/26/14 1108  GLUCAP 210* 240* 294* 231* 225* 234*   Fasting Lipid Panel:  Recent Labs Lab 07/22/14 0530  CHOL 180  HDL  34*  LDLCALC 108*  TRIG 191*  CHOLHDL 5.3   Urinalysis:  Recent Labs Lab 07/19/14 1757 07/21/14 2130  COLORURINE YELLOW YELLOW  LABSPEC 1.021 1.027  PHURINE 5.0 5.0  GLUCOSEU NEGATIVE NEGATIVE  HGBUR TRACE* NEGATIVE  BILIRUBINUR NEGATIVE SMALL*  KETONESUR NEGATIVE 15*  PROTEINUR >300* >300*  UROBILINOGEN 0.2 0.2  NITRITE NEGATIVE NEGATIVE  LEUKOCYTESUR TRACE* NEGATIVE    Medications: I have reviewed the patient's current medications. Scheduled Meds: . aspirin  300 mg Rectal Daily   Or  . aspirin  325 mg Oral Daily  . atorvastatin  40 mg Oral q1800  . carvedilol  12.5 mg Oral BID  . donepezil  10 mg Oral Daily  . heparin  5,000 Units Subcutaneous 3 times per day  . insulin aspart  0-15 Units Subcutaneous TID WC  . insulin glargine  10 Units Subcutaneous Daily  . LORazepam  0.5 mg Oral TID   Continuous Infusions:  PRN Meds:.acetaminophen, food thickener, senna-docusate   Assessment/Plan: 74 y/o M w/ PMHx of dementia, HTN, and DM type II, admitted for right thalamic/internal capsule CVA.  Right Thalamus/Internal Capsule Lacunar Infarct: Neuro exam unchanged today. No acute issues.  -Continue ASA 325 mg daily.  -Continue Lipitor 40 mg daily  -Pending  SNF placement  Acute on CKD: Slight increase in Cr this AM, trend as follows:   Recent Labs Lab 07/21/14 1937 07/21/14 2020 07/22/14 0749 07/24/14 0936 07/26/14 0414  CREATININE 2.70* 2.90* 2.56* 1.48* 1.61*  -Repeat BMP in SNF -Encourage adequate po intake.    DM2: HbA1c 6.0. On metformin 1000 mg bid, januvia 100 mg daily and lantus 16 U daily. CBG trend as follows:  Recent Labs Lab 07/25/14 1124 07/25/14 1648 07/25/14 2215 07/26/14 0711 07/26/14 1108  GLUCAP 240* 294* 231* 225* 234*  -Increase Lantus to home dose, 16 units.  -Continue SSI-M  Hypertension: Currently elevated. On carvedilol 12.5 mg bid, losartan-HCTZ 100-12.5 mg daily at home. Has been >72 hours since onset of CVA symptoms.    -Continue Coreg 12.5 mg bid -Restart Losartan 100 mg daily.  -Evaluate for further management in the AM.   Anemia: Takes ferrous sulfate 325 mg bid at home for presumed iron deficiency anemia. -Repeat CBC after discharge.   Advanced Dementia: Stable.  -Continue Aricept  DVT Ppx: Heparin [place]  Dispo: Disposition is deferred at this time, awaiting improvement of current medical problems.  Anticipated discharge in approximately 0-1 days. Pending SNF placement. FL2 signed and on chart.   The patient does have a current PCP (Ron ParkerSamuel Bowen, MD) and [facility] after discharge.  The patient does have transportation limitations that hinder transportation to clinic appointments.  .Services Needed at time of discharge: Y = Yes, Blank = No PT: SNF  OT: SNF  RN:   Equipment:   Other:     LOS: 5 days   Courtney ParisEden W Leili Eskenazi, MD 07/26/2014, 11:55 AM

## 2014-07-26 NOTE — Progress Notes (Signed)
Speech Language Pathology Treatment: Dysphagia;Cognitive-Linquistic  Patient Details Name: Barry MattocksJimmy Buckley MRN: [id number] DOB: 1941-04-20 Today's Date: 07/26/2014 Time: [id number] SLP Time Calculation (min) (ACUTE ONLY): 21 min  Assessment / Plan / Recommendation Clinical Impression  Pt continues to have overt coughing with thin liquid trials, although is tolerating current recommendation for nectar thick liquids. SLP provided Mod cues for use of swallowing strategies as well as for increased orientation to location and situation. With Mod-Max A for memory, pt was able to recall his location upon completion of the session.   HPI HPI: Barry Buckley is a 74 y.o. male with PMH significant for DM type II and dementia, [facility] sided weakness. MRI positive for acute RIGHT thalamus/internal capsule lacunar infarct.   Pertinent Vitals Pain Assessment: No/denies pain Faces Pain Scale: Hurts a little bit Pain Location: points to right abdomen, "feels like a knot" Pain Descriptors / Indicators: Aching Pain Intervention(s): Monitored during session;Repositioned  SLP Plan  Continue with current plan of care    Recommendations Diet recommendations: Dysphagia 3 (mechanical soft);Nectar-thick liquid Liquids provided via: Cup;No straw Medication Administration: Whole meds with puree Supervision: Patient able to self feed;Full supervision/cueing for compensatory strategies Compensations: Slow rate;Small sips/bites;Follow solids with liquid (check for residue) Postural Changes and/or Swallow Maneuvers: Seated upright 90 degrees              Oral Care Recommendations: Oral care BID Follow up Recommendations: Skilled Nursing facility;24 hour supervision/assistance Plan: Continue with current plan of care    GO     Barry Buckley, M.A. CCC-SLP [phone]  Barry Buckley, Barry Buckley 07/26/2014, 4:23 PM

## 2014-07-26 NOTE — Discharge Summary (Signed)
Name: Barry Buckley MRN: [id number] DOB: 12/11/1940 74 y.o. PCP: Ron Parker, MD  Date of Admission: 07/21/2014  7:18 PM Date of Discharge: 07/26/2014 Attending Physician: Earl Lagos, MD  Discharge Diagnosis: 1. Right Thalamic/Internal Capsule Lacunar Infarct 2. Dementia 3. HTN 4. DM type II 5. Acute on CKD   Discharge Medications:   Medication List    STOP taking these medications        acetaminophen 325 MG tablet  Commonly known as:  TYLENOL     divalproex 250 MG DR tablet  Commonly known as:  DEPAKOTE     DULoxetine 30 MG capsule  Commonly known as:  CYMBALTA     guaifenesin 100 MG/5ML syrup  Commonly known as:  ROBITUSSIN     loperamide 2 MG capsule  Commonly known as:  IMODIUM     losartan-hydrochlorothiazide 100-12.5 MG per tablet  Commonly known as:  HYZAAR     neomycin-bacitracin-polymyxin [phone] ointment     sertraline 25 MG tablet  Commonly known as:  ZOLOFT     traMADol-acetaminophen 37.5-325 MG per tablet  Commonly known as:  ULTRACET      TAKE these medications        alum & mag hydroxide-simeth 200-200-20 MG/5ML suspension  Commonly known as:  MAALOX/MYLANTA  Take 30 mLs by mouth every 6 (six) hours as needed for indigestion or heartburn.     aspirin 325 MG tablet  Take 1 tablet (325 mg total) by mouth daily.     atorvastatin 40 MG tablet  Commonly known as:  LIPITOR  Take 1 tablet (40 mg total) by mouth daily at 6 PM.     carvedilol 12.5 MG tablet  Commonly known as:  COREG  Take 12.5 mg by mouth 2 (two) times daily.     donepezil 10 MG tablet  Commonly known as:  ARICEPT  Take 10 mg by mouth daily.     ferrous sulfate 325 (65 FE) MG tablet  Take 325 mg by mouth 2 (two) times daily.     insulin glargine 100 UNIT/ML injection  Commonly known as:  LANTUS  Inject 16 Units into the skin at bedtime.     latanoprost 0.005 % ophthalmic solution  Commonly known as:  XALATAN  Place 1 drop into both eyes at bedtime.     LORazepam 0.5 MG tablet  Commonly known as:  ATIVAN  Take 0.5 mg by mouth 3 (three) times daily.     losartan 100 MG tablet  Commonly known as:  COZAAR  Take 1 tablet (100 mg total) by mouth daily.     magnesium hydroxide 400 MG/5ML suspension  Commonly known as:  MILK OF MAGNESIA  Take 30 mLs by mouth at bedtime as needed for mild constipation.     metFORMIN 1000 MG tablet  Commonly known as:  GLUCOPHAGE  Take 1,000 mg by mouth 2 (two) times daily.     pantoprazole 40 MG tablet  Commonly known as:  PROTONIX  Take 40 mg by mouth 2 (two) times daily.     sitaGLIPtin 100 MG tablet  Commonly known as:  JANUVIA  Take 100 mg by mouth daily.     thiamine 100 MG tablet  Commonly known as:  VITAMIN B-1  Take 100 mg by mouth daily.     traZODone 50 MG tablet  Commonly known as:  DESYREL  Take 50 mg by mouth at bedtime.     Vitamin D (Ergocalciferol) 50000 UNITS Caps capsule  Commonly known  as:  DRISDOL  Take 50,000 Units by mouth every Saturday.        Disposition and follow-up:   Mr.Andrell Whack was discharged from [facility] in Stable condition.  At the hospital follow up visit please address:  1.  CVA; Has patient's strength improved in LLE? Is he ambulating safely? Patient has history significant for falls.   HTN; If BP and Cr can tolerate, restart HCTZ 12.5.   Dementia; Only restarted Ativan + Aricept. Has Depakote, Zoloft, and Cymbalta on med list prior to admission. May be beneficial to restart if needed. Patient was very pleasant and had very little agitation during his hospital admission.    2.  Labs / imaging needed at time of follow-up: CBC, BMP  3.  Pending labs/ test needing follow-up: none  Follow-up Appointments:   Discharge Instructions:     Discharge Instructions    Ambulatory referral to Neurology    Complete by:  As directed   Pt will follow up with Dr. Roda Shutters at [facility] in about 2 months. Thanks.           Consultations:   Neuro  Procedures Performed:  Dg Chest 2 View  07/22/2014   CLINICAL DATA:  Stroke like symptoms.  EXAM: CHEST  2 VIEW  COMPARISON:  07/19/2014.  FINDINGS: There is lung base opacity, greater on the right, most evident in the posterior right lower lobe on the lateral view. This may all be atelectasis. Pneumonia should be considered in the proper clinical setting.  Mild interstitial prominence is seen bilaterally. This is similar to the prior exam allowing for differences in technique. It may all be chronic. Mild interstitial edema is possible.  Cardiac silhouette borderline enlarged. Aorta is mildly uncoiled. No mediastinal or hilar masses. The bony thorax is diffusely demineralized but grossly intact.  IMPRESSION: 1. Similar appearance of the chest radiograph to the prior exam. 2. Are right lower lobe opacity best seen posteriorly on the lateral view, is likely atelectasis. Pneumonia should be considered if there are consistent clinical symptoms. 3. Mild interstitial prominence is likely chronic. Consider mild interstitial edema if there shortness of breath.   Electronically Signed   By: Amie Portland M.D.   On: 07/22/2014 00:38   Dg Chest 2 View (if Patient Has Fever And/or Copd)  07/19/2014   CLINICAL DATA:  [facility] Initial encounter for fall 3 days ago 0 and now complaining of right flank pain.  EXAM: CHEST  2 VIEW  COMPARISON:  None.  FINDINGS: Lung volumes are low without focal airspace consolidation or pulmonary edema. No pleural effusion. No evidence for pneumothorax. Interstitial markings are diffusely coarsened with chronic features. Cardiopericardial silhouette is at upper limits of normal for size. Acute fractures of the right fifth and sixth ribs are evident.  IMPRESSION: Mild vascular congestion with chronic underlying interstitial coarsening.  Fractures of the posterior right fifth and sixth ribs.   Electronically Signed   By: Kennith Center M.D.   On: 07/19/2014 15:43   Dg Ribs Unilateral  Right  07/19/2014   CLINICAL DATA:  74 year old male all fall 3 days ago. Right flank pain.  EXAM: RIGHT RIBS - 2 VIEW  COMPARISON:  07/19/2014 chest x-ray.  FINDINGS: Acute right fourth through ninth rib fracture. Right tenth rib may also be fractured.  Remote right third and tenth rib fracture.  No obvious pneumothorax.  IMPRESSION: Acute right fourth through ninth rib fracture. Right tenth rib may also be fractured.   Electronically Signed  By: Bridgett Larsson M.D.   On: 07/19/2014 16:45   Dg Thoracic Spine 2 View  07/19/2014   CLINICAL DATA:  74 year old male with the midline lower back pain. Recent fall on Friday.  EXAM: THORACIC SPINE - 2 VIEW  COMPARISON:  Concurrently obtained radiographs of the lumbar spine, chest and ribs  FINDINGS: No evidence of acute fracture or malalignment. Multilevel flowing anterior osteophytes consistent with dystrophic idiopathic skeletal hyperostosis. Overall, the bones are slightly osteopenic. Atherosclerotic calcifications are noted in the thoracic aorta. A cardiac stent is visible.  IMPRESSION: 1. No acute fracture or malalignment. 2. Dystrophic idiopathic skeletal hyperostosis (DISH). 3. Aortic atherosclerosis.   Electronically Signed   By: Malachy Moan M.D.   On: 07/19/2014 16:42   Dg Thoracic Spine 2 View  07/16/2014   CLINICAL DATA:  Fall, thoracic spine pain.  EXAM: THORACIC SPINE - 2 VIEW  COMPARISON:  None.  FINDINGS: Degenerative changes throughout the thoracic spine with disc space narrowing and spurring. Normal alignment. No fracture.  IMPRESSION: Degenerative changes.  No acute bony abnormality.   Electronically Signed   By: Charlett Nose M.D.   On: 07/16/2014 18:53   Dg Lumbar Spine Complete  07/19/2014   CLINICAL DATA:  Fall 3 days ago with persistent right flank pain. Midline lower back pain and pain with coughing.  EXAM: LUMBAR SPINE - COMPLETE 4+ VIEW  COMPARISON:  CT 02/27/2014  FINDINGS: Vertebral body alignment and heights are within normal.  There is moderate spondylosis throughout the lumbar spine and visualized thoracic spine. There is disc space narrowing at the L3-4 and L5-S1 levels. These findings are unchanged. There is no compression fracture or subluxation. There is moderate facet arthropathy. There is moderate calcified plaque over the abdominal aorta and iliac arteries.  IMPRESSION: No acute findings.  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   Electronically Signed   By: Elberta Fortis M.D.   On: 07/19/2014 16:44   Ct Head Wo Contrast  07/21/2014   CLINICAL DATA:  Left sided weakness.  EXAM: CT HEAD WITHOUT CONTRAST  TECHNIQUE: Contiguous axial images were obtained from the base of the skull through the vertex without intravenous contrast.  COMPARISON:  07/16/2014  FINDINGS: Ventricles normal in overall configuration. There is ventricular and sulcal enlargement reflecting moderate atrophy. Several small lacune infarcts are noted in the basal ganglia and central white matter. Patchy white matter hypoattenuation is noted elsewhere consistent with chronic microvascular ischemic change.  No parenchymal masses or mass effect. There is no evidence of a recent cortical infarct.  There are no extra-axial masses or abnormal fluid collections.  There is no intracranial hemorrhage.  Visualized sinuses and mastoid air cells are clear. No skull lesion.  IMPRESSION: 1. No acute intracranial abnormalities. 2. No change from the recent prior CT scan the brain.   Electronically Signed   By: Amie Portland M.D.   On: 07/21/2014 22:33   Ct Head Wo Contrast  07/16/2014   CLINICAL DATA:  Initial encounter for fall today with trauma to back of head. Dementia.  EXAM: CT HEAD WITHOUT CONTRAST  TECHNIQUE: Contiguous axial images were obtained from the base of the skull through the vertex without intravenous contrast.  COMPARISON:  02/26/2014  FINDINGS: Sinuses/Soft tissues: Equivocal right posterior scalp soft  tissue swelling on image 25 series 3. No skull fracture. Clear paranasal sinuses and mastoid air cells.  Intracranial: Moderate low density in the periventricular white matter likely related  to small vessel disease. Advanced cerebral atrophy. Vertebral and carotid atherosclerosis bilaterally. Remote lacunar infarct in the left basal ganglia. No mass lesion, hemorrhage, hydrocephalus, acute infarct, intra-axial, or extra-axial fluid collection.  IMPRESSION: 1.  No acute intracranial abnormality. 2.  Cerebral atrophy and small vessel ischemic change.   Electronically Signed   By: Jeronimo Greaves M.D.   On: 07/16/2014 19:29   Mr Brain Wo Contrast  07/22/2014   CLINICAL DATA:  Nursing home patient noted to have LEFT-sided weakness at noon today, fell 2 days ago. Chest pain.  EXAM: MRI HEAD WITHOUT CONTRAST  MRA HEAD WITHOUT CONTRAST  TECHNIQUE: Multiplanar, multiecho pulse sequences of the brain and surrounding structures were obtained without intravenous contrast. Angiographic images of the head were obtained using MRA technique without contrast.  COMPARISON:  CT of the head July 29, 2014 at 2223 hr  FINDINGS: MRI HEAD FINDINGS  16 mm ovoid focus of reduced diffusion within the RIGHT thalamus/ posterior limb of the internal capsule, corresponding low ADC values consistent with acute process. No susceptibility artifact to suggest hemorrhage.  Patchy supratentorial, to lesser extent pontine T2 hyperintensities, with cystic changes within the pontine consistent with remote infarcts. Remote bilateral basal ganglia, bilateral thalamus lacunar infarcts. Remote bilateral small cerebellar infarcts. Moderate to severe ventriculomegaly, likely on the basis of global parenchymal brain volume loss as there is overall enlargement of the cerebral sulci and cerebellar folia. No midline shift or mass effect, No mass lesions. Mild mid brain atrophy though, the tectum appears normal.  No abnormal extra-axial fluid collections. Ocular  globes and orbital contents are unremarkable though not tailored for evaluation. Trace paranasal sinus mucosal thickening without air-fluid levels. The mastoid air cells are well aerated. No abnormal sellar expansion. No cerebellar tonsillar ectopia.  MRA HEAD FINDINGS  Anterior circulation: Normal flow related enhancement of the included cervical, petrous, cavernous and supra clinoid internal carotid arteries. Patent anterior communicating artery. Supernumerary RIGHT anterior cerebral artery arising from RIGHT A1-2 junction, normal variant. Normal flow related enhancement of the proximal anterior and middle cerebral arteries, including more distal segments. Mild luminal irregularity mid and distal branches. Focal mid to high-grade stenosis RIGHT M3/4 branch and distal RIGHT A2.  No large vessel occlusion, high-grade stenosis, aneurysm.  Posterior circulation: RIGHT vertebral artery is dominant. Focal luminal irregularity of the intradural RIGHT vertebral artery, axial of 22/154. Basilar artery is patent, with normal flow related enhancement of the main branch vessels. Normal flow related enhancement of the posterior cerebral arteries. Mild luminal irregularity of the mid and distal branches.  No large vessel occlusion, high-grade stenosis, aneurysm.  IMPRESSION: MRI HEAD: Acute RIGHT thalamus/internal capsule lacunar infarct.  Remote bilateral basal ganglia, thalamus lacunar infarcts. Remote pontine and cerebellar small infarcts. Moderate to severe white matter changes consistent with chronic small vessel ischemic disease.  Mild thinning of the midbrain, in a background of moderate to severe global parenchymal brain volume loss.  MRA HEAD: No large vessel occlusion.  Luminal irregularity of the RIGHT vertebral artery may reflect chronic dissection without stenosis, possible infundibulum.  Luminal irregularity of the cerebral arteries most consistent with atherosclerosis with mid high-grade stenosis RIGHT M3/4  branch and distal RIGHT A2.   Electronically Signed   By: Awilda Metro   On: 07/22/2014 01:45   Mr Maxine Glenn Head/brain Wo Cm  07/22/2014   CLINICAL DATA:  Nursing home patient noted to have LEFT-sided weakness at noon today, fell 2 days ago. Chest pain.  EXAM: MRI HEAD WITHOUT CONTRAST  MRA  HEAD WITHOUT CONTRAST  TECHNIQUE: Multiplanar, multiecho pulse sequences of the brain and surrounding structures were obtained without intravenous contrast. Angiographic images of the head were obtained using MRA technique without contrast.  COMPARISON:  CT of the head July 29, 2014 at 2223 hr  FINDINGS: MRI HEAD FINDINGS  16 mm ovoid focus of reduced diffusion within the RIGHT thalamus/ posterior limb of the internal capsule, corresponding low ADC values consistent with acute process. No susceptibility artifact to suggest hemorrhage.  Patchy supratentorial, to lesser extent pontine T2 hyperintensities, with cystic changes within the pontine consistent with remote infarcts. Remote bilateral basal ganglia, bilateral thalamus lacunar infarcts. Remote bilateral small cerebellar infarcts. Moderate to severe ventriculomegaly, likely on the basis of global parenchymal brain volume loss as there is overall enlargement of the cerebral sulci and cerebellar folia. No midline shift or mass effect, No mass lesions. Mild mid brain atrophy though, the tectum appears normal.  No abnormal extra-axial fluid collections. Ocular globes and orbital contents are unremarkable though not tailored for evaluation. Trace paranasal sinus mucosal thickening without air-fluid levels. The mastoid air cells are well aerated. No abnormal sellar expansion. No cerebellar tonsillar ectopia.  MRA HEAD FINDINGS  Anterior circulation: Normal flow related enhancement of the included cervical, petrous, cavernous and supra clinoid internal carotid arteries. Patent anterior communicating artery. Supernumerary RIGHT anterior cerebral artery arising from RIGHT A1-2  junction, normal variant. Normal flow related enhancement of the proximal anterior and middle cerebral arteries, including more distal segments. Mild luminal irregularity mid and distal branches. Focal mid to high-grade stenosis RIGHT M3/4 branch and distal RIGHT A2.  No large vessel occlusion, high-grade stenosis, aneurysm.  Posterior circulation: RIGHT vertebral artery is dominant. Focal luminal irregularity of the intradural RIGHT vertebral artery, axial of 22/154. Basilar artery is patent, with normal flow related enhancement of the main branch vessels. Normal flow related enhancement of the posterior cerebral arteries. Mild luminal irregularity of the mid and distal branches.  No large vessel occlusion, high-grade stenosis, aneurysm.  IMPRESSION: MRI HEAD: Acute RIGHT thalamus/internal capsule lacunar infarct.  Remote bilateral basal ganglia, thalamus lacunar infarcts. Remote pontine and cerebellar small infarcts. Moderate to severe white matter changes consistent with chronic small vessel ischemic disease.  Mild thinning of the midbrain, in a background of moderate to severe global parenchymal brain volume loss.  MRA HEAD: No large vessel occlusion.  Luminal irregularity of the RIGHT vertebral artery may reflect chronic dissection without stenosis, possible infundibulum.  Luminal irregularity of the cerebral arteries most consistent with atherosclerosis with mid high-grade stenosis RIGHT M3/4 branch and distal RIGHT A2.   Electronically Signed   By: Awilda Metro   On: 07/22/2014 01:45    2D Echo: 07/22/14  Study Conclusions:  - Left ventricle: The cavity size was normal. Wall thickness was normal. Systolic function was normal. The estimated ejection fraction was in the range of 60% to 65%. - Aortic valve: There was trivial regurgitation. - Mitral valve: There was mild regurgitation.   Admission HPI: Mr Szilagyi is a 74 year old man with dementia, DM2, HTN who presented from [facility] nursing  home with L sided weakness. History severely limited by Mr Crisostomo's dementia; his interview was primarily a review of systems. It does not appear he has any previous EPIC visits in Care Everywhere. Per EMR, the nursing home staff reported that around 1200 he was being helped into a wheelchair and they saw him dragging his left leg. They also saw him similarly dragging his leg at 1500  when helping him to the bathroom. The staff was unclear of the last time he was seen without this deficit. On interview, he was not clear if he felt weak anywhere. He says he thinks he may have had a stroke in the past? He denies any fevers, chest pain, SOB, headache, vision change, numbness, paresthesia, difficulty speaking or swallowing, abdominal pain, nausea, emesis, constipation, diarrhea, dysuria.  Of note, he was seen twice in the past week at the [facility] related to a fall on 07/16/14 where he told triage he hit his head but later denied hitting his head. Negative head Ct at that time w T spine film demonstrating degenerative changes and no acute bony abnormality. He then returned on 07/19/14 with R chest wall pain found to have fractures of the R 4th-9th ribs with the R 10th rib also possibly fractured. He was discharged from ED with tramadol-acetaminophen 37.5-325 mg 1 tab q6hprn x 30 tabs.  We spoke to [facility] nursing staff tonight. They confirmed he is full code.They will fax over records as did not provide further PMH than "he has dementia, hypertension, and diabetes."  In the ED tonight, he received ASA 300 mg pr once, morphine 4 mg iv once and 1 L NS bolus. Neurology was called but had not yet seen the patient.  Hospital Course by problem list:   1. Right Thalamic/Internal Capsule Lacunar Infarct- Presented w/ weakness in the LLE and some mild weakness in LUE. History significantly limited by patient's dementia. No tPa was given by ED due to unknown time frame. While patient said he might have had stroke in the  past when questioned, he is unreliable. He received ASA 300 mg per rectum in the ED. CT head showed no acute abnormalities. MRI showed acute right thalamus/internal capsule lacunar infarct and no large vessel occlusion. Carotid dopplers show 1-39% ICA stenosis bilaterally. Fasting lipid panel shows cholesterol 180, triglycerides 191, HDL 34, LDL 108. ECHO performed showed EF of 60-65% w/ trivial aortic regurgitation and mild mitral regurgitation. HbA1c 6.0. Started on ASA 425, Lipitor 40 mg qhs. Allowed for permissive HTN until 07/25/14, restarted Coreg 12.5 mg bid and Cozaar 100 mg daily on 07/26/14. Weakness slightly improved over time, 3-4/5 strength in the LLE, 4-5/5 strength in RUE.   2. Dementia- Patient quite pleasantly demented, only mild agitation during hospital stay. PTA medications list Depakote, Cymbalta, Zoloft, Ativan, and Aricept. Only restarted Aricept and Ativan during admission, did not require other medications. Unclear as to whether patient was receiving these prior to admission. In order to reduce polypharmacy, did not restart Cymbalta, Depakote, or Zoloft. Patient was quite calm, content, answered questions appropriately. May need these restarted if felt to be necessary after discharge.   3. HTN- On carvedilol 12.5 mg bid, losartan-HCTZ 100-12.5 mg daily at home. Held these medications during admission to allow for permissive HTN. Restarted Coreg on 07/25/14, and Losartan 100 mg daily on 07/26/14. Can restart HCTZ if BP tolerates and Cr stable.   4. DM type II- HbA1c 6.0. On metformin 1000 mg bid, januvia 100 mg daily and lantus 16 U daily at home. Started patient on ISS-M + Lantus 8 units, which was increased to 10 units prior to discharge. Most recent CBG's as follows:   Recent Labs Lab 07/25/14 1124 07/25/14 1648 07/25/14 2215 07/26/14 0711 07/26/14 1108  GLUCAP 240* 294* 231* 225* 234*  Suggest resuming home medications on discharge.   5. Acute on CKD- Patient presented w/ Cr  of 2.70, trend  as follows:   Recent Labs Lab 07/21/14 1937 07/21/14 2020 07/22/14 0749 07/24/14 0936 07/26/14 0414  CREATININE 2.70* 2.90* 2.56* 1.48* 1.61*  Will need repeat BMP in 1-2 weeks. Needs adequate po intake.   Discharge Vitals:   BP 151/80 mmHg  Pulse 77  Temp(Src) 98 F (36.7 C) (Oral)  Resp 22  Ht 5\' 11"  (1.803 m)  Wt 185 lb 3.2 oz (84.006 kg)  BMI 25.84 kg/m2  SpO2 95%  Discharge Labs:  Results for orders placed or performed during the hospital encounter of 07/21/14 (from the past 24 hour(s))  Glucose, capillary     Status: Abnormal   Collection Time: 07/25/14  4:48 PM  Result Value Ref Range   Glucose-Capillary 294 (H) 70 - 99 mg/dL  Glucose, capillary     Status: Abnormal   Collection Time: 07/25/14 10:15 PM  Result Value Ref Range   Glucose-Capillary 231 (H) 70 - 99 mg/dL   Comment 1 Documented in Chart    Comment 2 Notify RN   Basic metabolic panel     Status: Abnormal   Collection Time: 07/26/14  4:14 AM  Result Value Ref Range   Sodium 143 135 - 145 mmol/L   Potassium 4.2 3.5 - 5.1 mmol/L   Chloride 109 96 - 112 mmol/L   CO2 27 19 - 32 mmol/L   Glucose, Bld 297 (H) 70 - 99 mg/dL   BUN 34 (H) 6 - 23 mg/dL   Creatinine, Ser 1.611.61 (H) 0.50 - 1.35 mg/dL   Calcium 9.4 8.4 - 09.610.5 mg/dL   GFR calc non Af Amer 41 (L) >90 mL/min   GFR calc Af Amer 47 (L) >90 mL/min   Anion gap 7 5 - 15  Glucose, capillary     Status: Abnormal   Collection Time: 07/26/14  7:11 AM  Result Value Ref Range   Glucose-Capillary 225 (H) 70 - 99 mg/dL   Comment 1 Documented in Chart    Comment 2 Notify RN   Glucose, capillary     Status: Abnormal   Collection Time: 07/26/14 11:08 AM  Result Value Ref Range   Glucose-Capillary 234 (H) 70 - 99 mg/dL   Comment 1 Notify RN    Comment 2 Documented in Chart     Signed: Courtney ParisEden W Sabino Denning, MD 07/26/2014, 1:24 PM    Services Ordered on Discharge: SNF Equipment Ordered on Discharge: None

## 2014-07-26 NOTE — Clinical Social Work Note (Signed)
Clinical Social Worker [facility]. CSW re-faxed clinicals to [employer] as requested by pt's dtr, Misty StanleyLisa.   FL-2 signed.   CSW continues to follow patient and pt's family for continued support and to facilitate pt's discharge needs once medically stable.   Derenda FennelBashira Daphna Lafuente, MSW, LCSWA [phone] 07/26/2014 11:50 AM

## 2014-07-26 NOTE — Progress Notes (Signed)
Report to Nurse Monica Martinezemy from [facility]. Awaiting for [facility] for transportation.   Sim BoastHavy, RN

## 2014-07-26 NOTE — Progress Notes (Signed)
Physical Therapy Treatment Patient Details Name: Barry Buckley MRN: [id number] DOB: 26-Feb-1941 Today's Date: 07/26/2014    History of Present Illness This 74 y.o. admitted from [facility] with Lt sided weakness.  MRI revealed Rt. Thalamus/internal lacuner infarct.  Remote bil. basal ganglia, thalamus lacunar infarcts; remote pontine and cerebellar small infarcts.  PMH includes;  dementia, DM; acute on chronic renal disease; HTN    PT Comments    Patient progressing slowly with mobility. Pt with less left lateral lean during sitting and standing today allowing for more mobility and upright posture. Improved ambulation distance with continued difficulty advancing LLE, most notably when fatigued. Continues to be appropriate for ST SNF.    Follow Up Recommendations  SNF;Supervision/Assistance - 24 hour     Equipment Recommendations  None recommended by PT    Recommendations for Other Services       Precautions / Restrictions Precautions Precautions: Fall Restrictions Weight Bearing Restrictions: No    Mobility  Bed Mobility Overal bed mobility: Needs Assistance Bed Mobility: Supine to Sit     Supine to sit: Mod assist;HOB elevated     General bed mobility comments: Use of rails. Step by step cues for technique. increased time. Pt reaching out for therapist's hand to assist with trunk.  Transfers Overall transfer level: Needs assistance Equipment used: Rolling walker (2 wheeled) Transfers: Sit to/from [employer] Sit to Stand: Min assist Stand pivot transfers: Mod assist       General transfer comment: Min A to rise from EOB and chair x2 with manual cues for hand placement/anterior translation. SPT bed to chair with Mod A of 1.  Ambulation/Gait Ambulation/Gait assistance: Min assist Ambulation Distance (Feet): 10 Feet (x2 bouts) Assistive device: Rolling walker (2 wheeled) Gait Pattern/deviations: Step-to pattern;Decreased stride length;Decreased  step length - left;Trunk flexed;Step-through pattern Gait velocity: slow   General Gait Details: Pt with difficulty clearing LLE when fatigued requiring Min A to facilitate hip flexion and advancement. Dragging of LLE on second ambulation bout requiring cues for weight shifting and verbal cues to advance LE. Mild left lateral lean improved from prior session. 1 seated rest break btw bouts.   Stairs            Wheelchair Mobility    Modified Rankin (Stroke Patients Only) Modified Rankin (Stroke Patients Only) Pre-Morbid Rankin Score: Moderately severe disability Modified Rankin: Moderately severe disability     Balance Overall balance assessment: Needs assistance Sitting-balance support: Feet supported;No upper extremity supported Sitting balance-Leahy Scale: Fair Sitting balance - Comments: Able to sit upright without back support for short periods.   Standing balance support: During functional activity Standing balance-Leahy Scale: Poor                      Cognition Arousal/Alertness: Awake/alert Behavior During Therapy: WFL for tasks assessed/performed Overall Cognitive Status: No family/caregiver present to determine baseline cognitive functioning                      Exercises      General Comments        Pertinent Vitals/Pain Pain Assessment: Faces Faces Pain Scale: Hurts a little bit Pain Location: points to right abdomen, "feels like a knot" Pain Descriptors / Indicators: Aching Pain Intervention(s): Monitored during session;Repositioned    Home Living                      Prior Function  PT Goals (current goals can now be found in the care plan section) Progress towards PT goals: Progressing toward goals    Frequency  Min 3X/week    PT Plan Current plan remains appropriate    Co-evaluation             End of Session Equipment Utilized During Treatment: Gait belt Activity Tolerance: Patient  tolerated treatment well Patient left: in chair;with chair alarm set;with call bell/phone within reach     Time: 1426-1445 PT Time Calculation (min) (ACUTE ONLY): 19 min  Charges:  $Gait Training: 8-22 mins                    G CodesAlvie Heidelberg:      Folan, Haiden Clucas A 07/26/2014, 4:10 PM  Alvie HeidelbergShauna Folan, PT, DPT [phone]

## 2014-07-26 NOTE — Progress Notes (Signed)
Pt seen and examined. Case d/w with residents in detail on morning rounds. I agree with findings and plan as outlined in Dr. Yetta BarreJones' note.  Pt feels well and denies any weakness. No new complaints   Physical Exam: Gen: AAO*1, NAD CVS: RRR, normal heart sounds Lungs: CTA b/l Abd: soft, non tender, BS + Ext: no edema   Assessment and Plan: 74 y/o male with new left sided weakness secondary to CVA  Acute CVA: - pt with acute R thalamic/internal capsule infarct - Neuro signed off. Will need outpatient f/u in 2 months - c/w asa, lipitor - carotid dopplers and 2 D ECHO results noted - c/w dysphagia 3 diet. SLP recommendations appreciated  AKI on CKD: - Cr now improved. Will need repeat BMP in 1 week at SNF - Hold HCTZ for now. Would resume if Cr stable and BP elevated  HTN: - c/w coreg and restarted on losartan today  DM: - BS 200s. Would resume home meds on d/c  Pt is stable for d/c to SNF once bed available

## 2014-07-26 NOTE — Progress Notes (Signed)
.   Subjective: Patient complained of mild lower back pain. Otherwise, he reports he is eating well and wanted [place] to update his daughter about how he is doing.  Objective: Vital signs in last 24 hours: Filed Vitals:   07/25/14 2154 07/26/14 0209 07/26/14 0535 07/26/14 0926  BP: 140/77 157/71 169/83 151/80  Pulse: 79 71 122 77  Temp: 98.4 F (36.9 C) 98 F (36.7 C) 97.4 F (36.3 C) 98 F (36.7 C)  TempSrc: Oral Oral Oral Oral  Resp: Height:      Weight:      SpO2: 98% 96% 95% 95%   Weight change:   Intake/Output Summary (Last 24 hours) at 07/26/14 1109 Last data filed at 07/26/14 1610  Gross per 24 hour  Intake    360 ml  Output    700 ml  Net   -340 ml   Constitutional: Patient sitting up in bed with help from PT. Cooperative with exam Nose: No erythema or drainage noted Mouth: No erythema or exudates, MMM Eyes: PERRL, EOMI, conjunctivae normal Cardiovascular: RRR, S1 normal, S2 normal, no murmurs Pulmonary/Chest: normal respiratory effort, CTAB, no wheezes, rales, or rhonchi Abdominal: Soft, non-tender, non-distended, bowel sounds present, no masses, organomegaly, or guarding present Neurological: Alert, oriented to person but not place or time (states he is at [place] and does not know year). 5/5 strength at hip and plantar and dorsiflexion bilaterally Psychiatric: Normal mood and affect  Lab Results: Basic Metabolic Panel:  Recent Labs Lab 07/22/14 0749 07/24/14 0936 07/26/14 0414  NA 142 141 143  K 3.9 3.7 4.2  CL 109 109 109  CO2 GLUCOSE 99 210* 297*  BUN 57* 30* 34*  CREATININE 2.56* 1.48* 1.61*  CALCIUM 9.4 9.4 9.4  MG 2.0  --   --    Liver Function Tests: No results for input(s): AST, ALT, ALKPHOS, BILITOT, PROT, ALBUMIN in the last 168 hours. No results for input(s): LIPASE, AMYLASE in the last 168 hours. No results for input(s): AMMONIA in the last 168 hours. CBC:  Recent Labs Lab 07/21/14 2020 07/22/14 0749  07/24/14 0936  WBC 12.9* 9.0 5.4  NEUTROABS 10.5*  --   --   HGB 10.1* 9.7* 10.1*  HCT 30.5* 29.2* 29.4*  MCV 87.9 88.8 86.5  PLT 256 233 281   Cardiac Enzymes: No results for input(s): CKTOTAL, CKMB, CKMBINDEX, TROPONINI in the last 168 hours. BNP: No results for input(s): PROBNP in the last 168 hours. D-Dimer: No results for input(s): DDIMER in the last 168 hours. CBG:  Recent Labs Lab 07/24/14 2110 07/25/14 0653 07/25/14 1124 07/25/14 1648 07/25/14 2215 07/26/14 0711  GLUCAP 281* 210* 240* 294* 231* 225*   Hemoglobin A1C:  Recent Labs Lab 07/22/14 0537  HGBA1C 6.0*   Fasting Lipid Panel:  Recent Labs Lab 07/22/14 0530  CHOL 180  HDL 34*  LDLCALC 108*  TRIG 191*  CHOLHDL 5.3   Thyroid Function Tests: No results for input(s): TSH, T4TOTAL, FREET4, T3FREE, THYROIDAB in the last 168 hours. Coagulation: No results for input(s): LABPROT, INR in the last 168 hours. Anemia Panel: No results for input(s): VITAMINB12, FOLATE, FERRITIN, TIBC, IRON, RETICCTPCT in the last 168 hours. Urine Drug Screen: Drugs of Abuse  No results found for: LABOPIA, COCAINSCRNUR, LABBENZ, AMPHETMU, THCU, LABBARB  Alcohol Level: No results for input(s): ETH in the last 168 hours. Urinalysis:  Recent Labs Lab 07/19/14 1757 07/21/14 2130  COLORURINE YELLOW YELLOW  LABSPEC  1.021 1.027  PHURINE 5.0 5.0  GLUCOSEU NEGATIVE NEGATIVE  HGBUR TRACE* NEGATIVE  BILIRUBINUR NEGATIVE SMALL*  KETONESUR NEGATIVE 15*  PROTEINUR >300* >300*  UROBILINOGEN 0.2 0.2  NITRITE NEGATIVE NEGATIVE  LEUKOCYTESUR TRACE* NEGATIVE    Micro Results: No results found for this or any previous visit (from the past 240 hour(s)). Studies/Results: No results found. Medications:  Scheduled Meds: . aspirin  300 mg Rectal Daily   Or  . aspirin  325 mg Oral Daily  . atorvastatin  40 mg Oral q1800  . carvedilol  12.5 mg Oral BID  . donepezil  10 mg Oral Daily  . heparin  5,000 Units Subcutaneous 3  times per day  . insulin aspart  0-15 Units Subcutaneous TID WC  . insulin glargine  10 Units Subcutaneous Daily  . LORazepam  0.5 mg Oral TID   Continuous Infusions:  PRN Meds:.acetaminophen, food thickener, senna-docusate  Assessment/Plan: Active Problems:   CVA (cerebral vascular accident)   Stroke-like symptoms   Left-sided weakness   Essential hypertension   HLD (hyperlipidemia)   Type 2 diabetes mellitus with other circulatory complications   Dementia  Barry Buckley is a 74 y.o. male w/ PMHx of dementia, HTN, and DM type II, admitted for right thalamic/internal capsule CVA.  Right Thalamus/Internal Capsule Lacunar Infarct: Neuro exam unchanged today. No complaints. -Continue ASA 325 mg daily.  -Continue Lipitor 40 mg daily -New speech eval ordered to reassess before discharge  -Pending SNF placement  Acute on CKD: Latest Cr 1.61 with Cr of 2.9 on admission. BUN/Cr ratio of 21 so likely dehydrated -Encourage hydration by mouth  DM2: HbA1c 6.0. On metformin 1000 mg bid, januvia 100 mg daily and lantus 16 U daily. Blood glucose at 297 today -Increase Lantus to 16 units  -Continue SSI-M  Hypertension: Currently 169/79. On carvedilol 12.5 mg bid, losartan-HCTZ 100-12.5 mg daily at home. Has been >72 hours since onset of CVA symptoms.  -Continue home Coreg 12.5 mg bid -Start on Losartan 100 mg daily. At home he is on Losartan-HCTZ 100-12.5 mg daily -Evaluate for further management in the AM  Anemia: Hb 10.1 most recently. Takes ferrous sulfate 325 mg bid at home for presumed iron deficiency anemia.  Advanced Dementia: A&O x1. Currently on donepezil 10 mg daily at home. -Continue Aricept  Diet: SLP also recommends SNF. -Dysphagia 3  DVT Ppx: Heparin [place]  Dispo: Disposition is deferred at this time, awaiting improvement of current medical problems. Anticipated discharge in approximately 2-3 day(s). Pending SNF placement. Cannot return to ALF, requires increased level of  care at this time.   The patient does have a current PCP (Barry ParkerSamuel Bowen, MD) and [facility] after discharge.  The patient does have transportation limitations that hinder transportation to clinic appointments.  .Services Needed at time of discharge: Y = Yes, Blank = No PT: SNF  OT: SNF  RN:   Equipment:   Other:      LOS: 5 days   This is a Psychologist, occupationalMedical Student Note. The care of the patient was discussed with Dr. Yetta BarreJones and the assessment and plan was formulated with their assistance. Please see their note for official documentation of the patient encounter.  Signed: Chiquita LothLina Carballo Devaeh Buckley, Med Student Internal Medicine Teaching Service Team B2  [phone] 07/26/2014, 11:09 AM

## 2014-07-26 NOTE — Clinical Social Work Note (Signed)
Clinical Social Worker facilitated patient discharge including contacting patient family and facility to confirm patient discharge plans.  Clinical information faxed to facility and family agreeable with plan.  CSW [facility]  RN to call report prior to discharge.  Clinical Social Worker will sign off for now as social work intervention is no longer needed. Please consult us again if new need arises.  Derenda FennelBashira Vivienne Sangiovanni, MSW, LCSWA [phone] 07/26/2014 2:54 PM

## 2014-07-26 NOTE — Progress Notes (Signed)
Called [facility] to confirmed transportation.. Spoke with David and he verbalized that EMS is "pretty slammed" and will arrive shortly to transport patient to facility.  Tyra Gural, RN.  

## 2014-08-31 DEATH — deceased

## 2016-07-09 IMAGING — MR MR HEAD W/O CM
9 of 11 series · 30 of 48 positions shown · non-contrast
Comparison: CT of the head July 29, 2014 at 1114 hr

CLINICAL DATA: [HOSPITAL] patient noted to have LEFT-sided
weakness at noon today, fell 2 days ago. Chest pain.

EXAM:
MRI HEAD WITHOUT CONTRAST
MRA HEAD WITHOUT CONTRAST
TECHNIQUE: Multiplanar, multiecho pulse sequences of the brain and surrounding
structures were obtained without intravenous contrast. Angiographic
images of the head were obtained using MRA technique without
contrast.

[Series 3: DWI · axial · 3.0mm · 1.09mm/px · z∈[-76,+62]mm · 7 of 94 slices shown (1 of 4)]
[im 1/94]
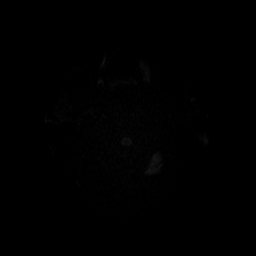
[im 16/94]
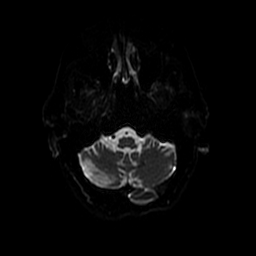
[im 32/94]
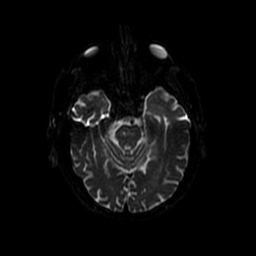
[im 47/94]
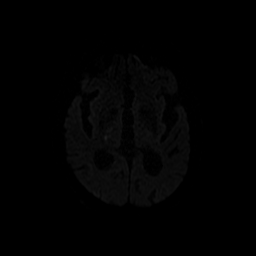
[im 63/94]
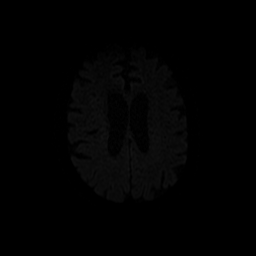
[im 78/94]
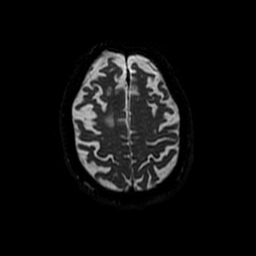
[im 94/94]
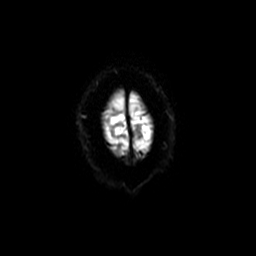

[Series 4: T1 · sagittal · 5.0mm · 0.47mm/px · 2 of 25 slices shown]
[im 1/25]
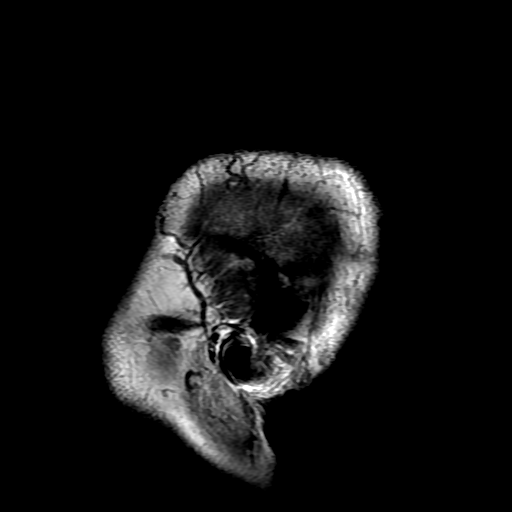
[im 25/25]
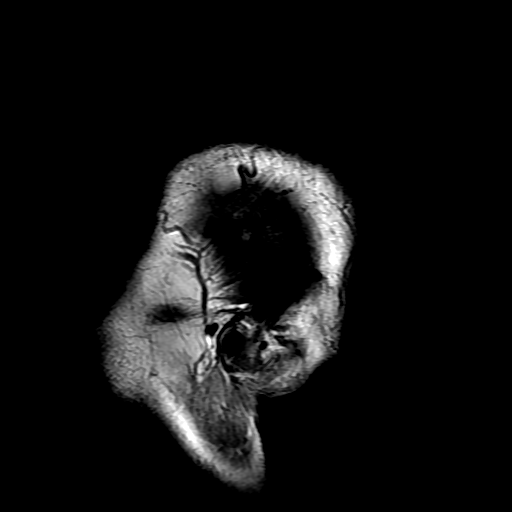

[Series 5: DWI · coronal · 5.0mm · 1.09mm/px · 5 of 66 slices shown (2 of 4)]
[im 1/66]
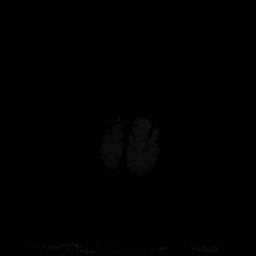
[im 17/66]
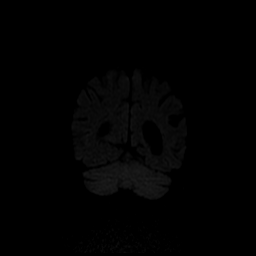
[im 33/66]
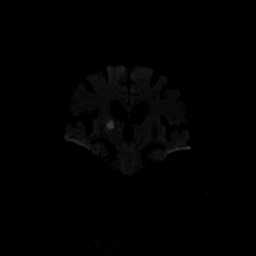
[im 49/66]
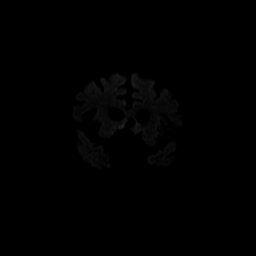
[im 66/66]
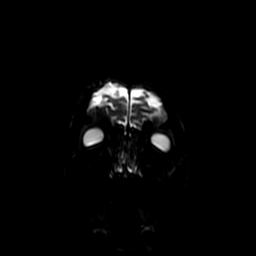

[Series 6: (id) mt fs · axial · 1.4mm · 0.43mm/px · z∈[-88,-40]mm · 4 of 154 slices shown]
[im 1/154]
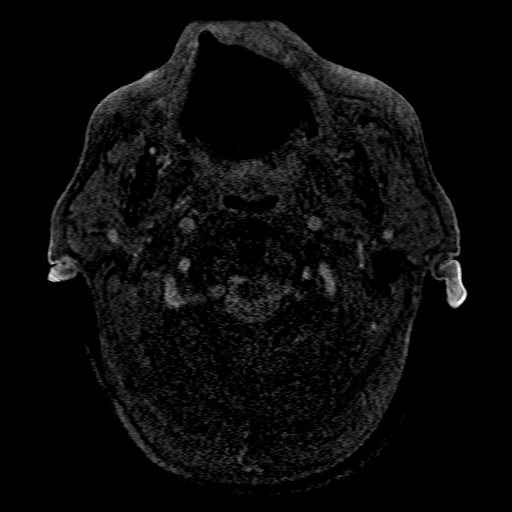
[im 28/154]
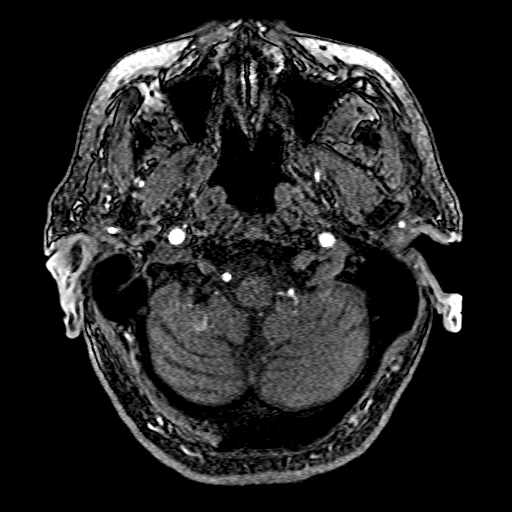
[im 42/154]
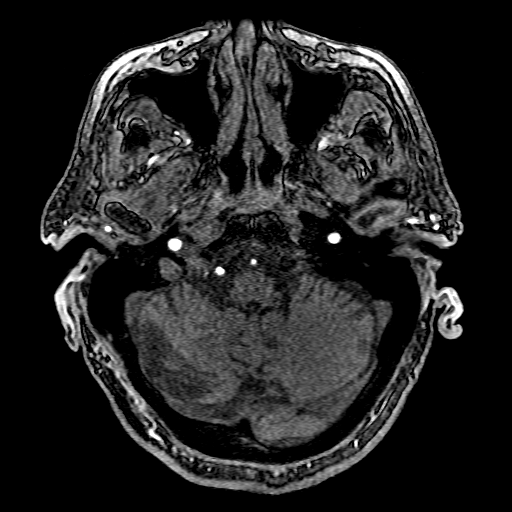
[im 70/154]
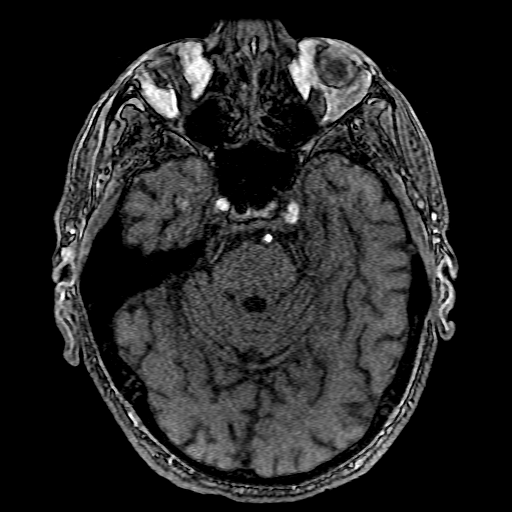

[Series 7: T2 · axial · 5.0mm · 0.43mm/px · z∈[-86,+69]mm · 2 of 27 slices shown (1 of 2)]
[im 1/27]
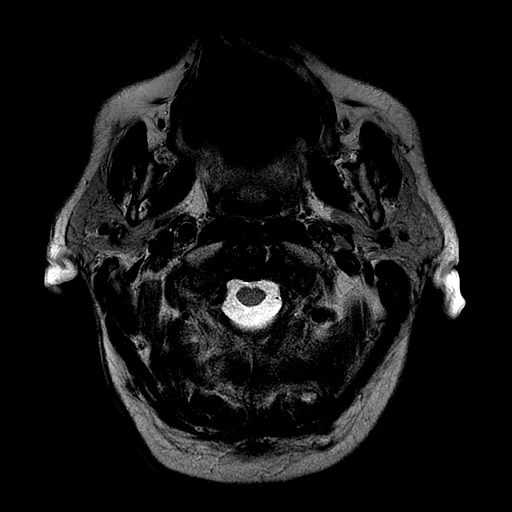
[im 27/27]
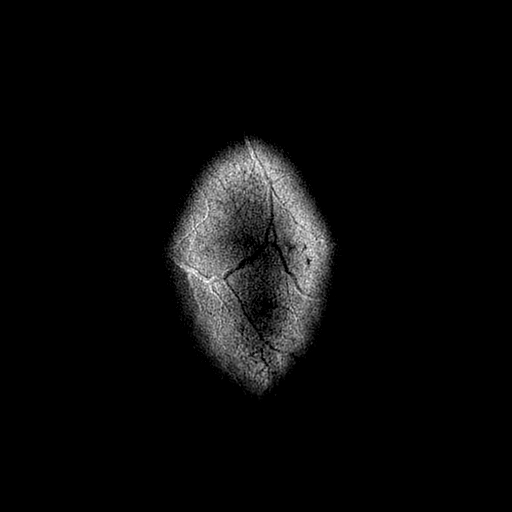

[Series 8: FLAIR · axial · 5.0mm · 0.43mm/px · z∈[-86,+69]mm · 2 of 27 slices shown]
[im 1/27]
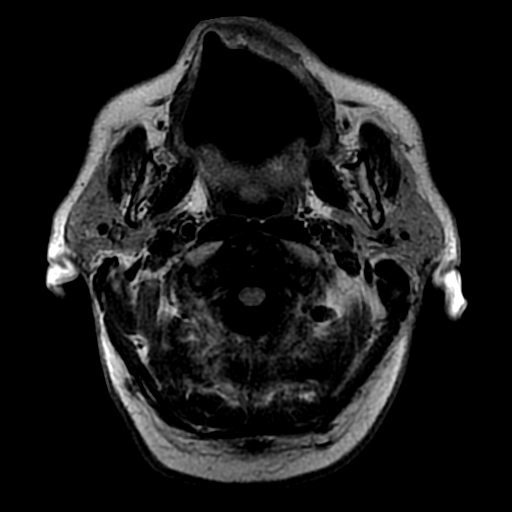
[im 27/27]
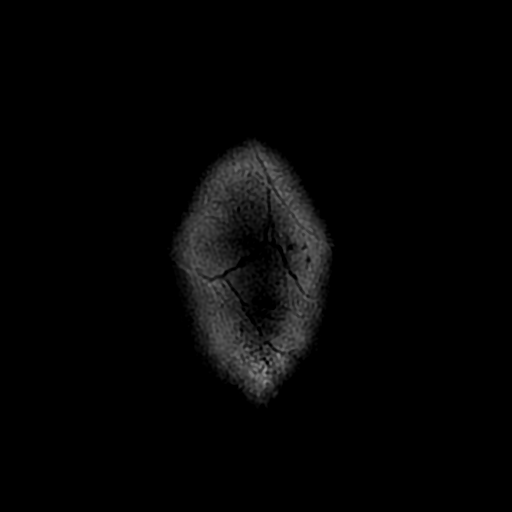

[Series 14: T2 · coronal · 5.0mm · 0.43mm/px · 2 of 33 slices shown (2 of 2)]
[im 1/33]
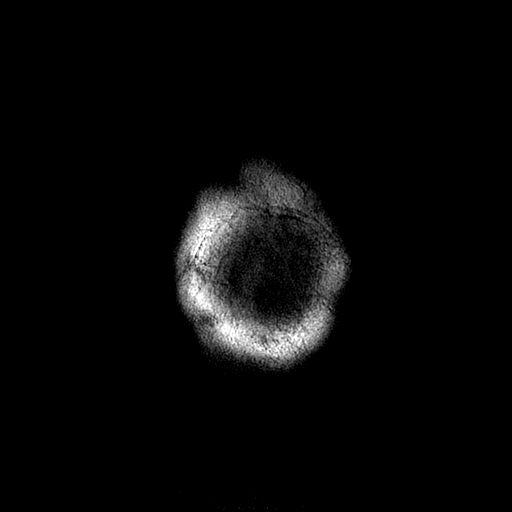
[im 33/33]
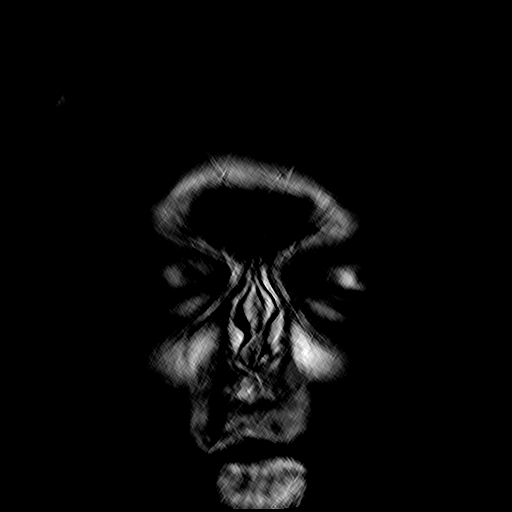

[Series 300: DWI · axial · 3.0mm · 1.09mm/px · z∈[-76,+62]mm · 4 of 47 slices shown (3 of 4)]
[im 1/47]
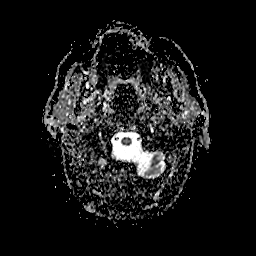
[im 16/47]
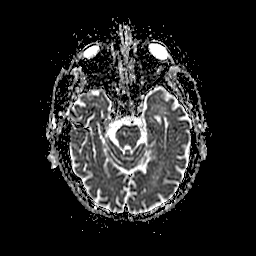
[im 31/47]
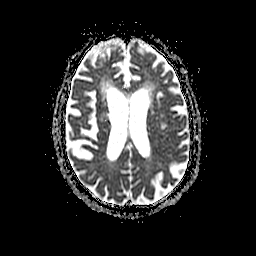
[im 47/47]
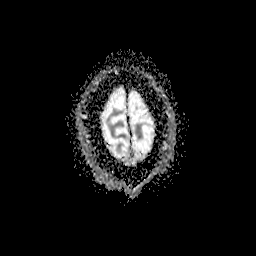

[Series 500: DWI · coronal · 5.0mm · 1.09mm/px · 2 of 33 slices shown (4 of 4)]
[im 1/33]
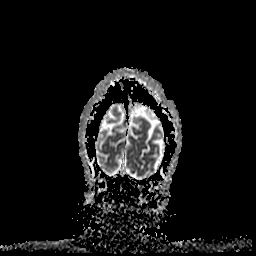
[im 33/33]
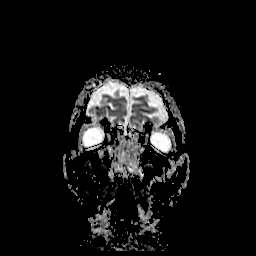

[30 of 48 positions shown; findings below may reference images not displayed]

FINDINGS: MRI HEAD FINDINGS

16 mm ovoid focus of reduced diffusion within the RIGHT thalamus/
posterior limb of the internal capsule, corresponding low ADC values
consistent with acute process. No susceptibility artifact to suggest
hemorrhage.

Patchy supratentorial, to lesser extent pontine T2 hyperintensities,
with cystic changes within the pontine consistent with remote
infarcts. Remote bilateral basal ganglia, bilateral thalamus lacunar
infarcts. Remote bilateral small cerebellar infarcts. Moderate to
severe ventriculomegaly, likely on the basis of global parenchymal
brain volume loss as there is overall enlargement of the cerebral
sulci and cerebellar folia. No midline shift or mass effect, No mass
lesions. Mild mid brain atrophy though, the tectum appears normal.

No abnormal extra-axial fluid collections. Ocular globes and orbital
contents are unremarkable though not tailored for evaluation. Trace
paranasal sinus mucosal thickening without air-fluid levels. The
mastoid air cells are well aerated. No abnormal sellar expansion. No
cerebellar tonsillar ectopia.

MRA HEAD FINDINGS

Anterior circulation: Normal flow related enhancement of the
included cervical, petrous, cavernous and supra clinoid internal
carotid arteries. Patent anterior communicating artery.
Supernumerary RIGHT anterior cerebral artery arising from RIGHT A1-2
junction, normal variant. Normal flow related enhancement of the
proximal anterior and middle cerebral arteries, including more
distal segments. Mild luminal irregularity mid and distal branches.
Focal mid to high-grade stenosis RIGHT M3/4 branch and distal RIGHT
A2.

No large vessel occlusion, high-grade stenosis, aneurysm.

Posterior circulation: RIGHT vertebral artery is dominant. Focal
luminal irregularity of the intradural RIGHT vertebral artery, axial
of 22/154. Basilar artery is patent, with normal flow related
enhancement of the main branch vessels. Normal flow related
enhancement of the posterior cerebral arteries. Mild luminal
irregularity of the mid and distal branches.

No large vessel occlusion, high-grade stenosis, aneurysm.
IMPRESSION: MRI HEAD: Acute RIGHT thalamus/internal capsule lacunar infarct.

Remote bilateral basal ganglia, thalamus lacunar infarcts. Remote
pontine and cerebellar small infarcts. Moderate to severe white
matter changes consistent with chronic small vessel ischemic
disease.

Mild thinning of the midbrain, in a background of moderate to severe
global parenchymal brain volume loss.

MRA HEAD: No large vessel occlusion.

Luminal irregularity of the RIGHT vertebral artery may reflect
chronic dissection without stenosis, possible infundibulum.

Luminal irregularity of the cerebral arteries most consistent with
atherosclerosis with mid high-grade stenosis RIGHT M3/4 branch and
distal RIGHT A2.

  By: Pastor Shawn
